# Patient Record
Sex: Female | Born: 1960 | Hispanic: Yes | Marital: Married | Smoking: Never smoker
Health system: Southern US, Community
[De-identification: ages and names within clinical notes are randomized; demographics above are authoritative.]

## PROBLEM LIST (undated history)

## (undated) DIAGNOSIS — T7840XA Allergy, unspecified, initial encounter: Secondary | ICD-10-CM

## (undated) DIAGNOSIS — E559 Vitamin D deficiency, unspecified: Secondary | ICD-10-CM

## (undated) DIAGNOSIS — K649 Unspecified hemorrhoids: Secondary | ICD-10-CM

## (undated) DIAGNOSIS — R519 Headache, unspecified: Secondary | ICD-10-CM

## (undated) DIAGNOSIS — G8929 Other chronic pain: Secondary | ICD-10-CM

## (undated) DIAGNOSIS — R51 Headache: Secondary | ICD-10-CM

## (undated) DIAGNOSIS — K602 Anal fissure, unspecified: Secondary | ICD-10-CM

## (undated) DIAGNOSIS — K589 Irritable bowel syndrome without diarrhea: Secondary | ICD-10-CM

## (undated) DIAGNOSIS — E039 Hypothyroidism, unspecified: Secondary | ICD-10-CM

## (undated) DIAGNOSIS — U071 COVID-19: Secondary | ICD-10-CM

## (undated) DIAGNOSIS — F419 Anxiety disorder, unspecified: Secondary | ICD-10-CM

## (undated) DIAGNOSIS — K5790 Diverticulosis of intestine, part unspecified, without perforation or abscess without bleeding: Secondary | ICD-10-CM

## (undated) HISTORY — DX: Unspecified hemorrhoids: K64.9

## (undated) HISTORY — DX: Allergy, unspecified, initial encounter: T78.40XA

## (undated) HISTORY — DX: Irritable bowel syndrome, unspecified: K58.9

## (undated) HISTORY — PX: OVARY BIOPSY: SHX2143

## (undated) HISTORY — DX: Diverticulosis of intestine, part unspecified, without perforation or abscess without bleeding: K57.90

## (undated) HISTORY — DX: Headache, unspecified: R51.9

## (undated) HISTORY — DX: Vitamin D deficiency, unspecified: E55.9

## (undated) HISTORY — DX: Hypothyroidism, unspecified: E03.9

## (undated) HISTORY — DX: Other chronic pain: G89.29

## (undated) HISTORY — DX: Anxiety disorder, unspecified: F41.9

## (undated) HISTORY — DX: COVID-19: U07.1

## (undated) HISTORY — DX: Headache: R51

## (undated) HISTORY — DX: Anal fissure, unspecified: K60.2

---

## 2000-03-17 ENCOUNTER — Encounter: Payer: Self-pay | Admitting: Emergency Medicine

## 2000-03-17 ENCOUNTER — Emergency Department (HOSPITAL_COMMUNITY): Admission: EM | Admit: 2000-03-17 | Discharge: 2000-03-17 | Payer: Self-pay | Admitting: Emergency Medicine

## 2000-04-09 ENCOUNTER — Other Ambulatory Visit: Admission: RE | Admit: 2000-04-09 | Discharge: 2000-04-09 | Payer: Self-pay | Admitting: Gynecology

## 2006-09-24 ENCOUNTER — Other Ambulatory Visit: Admission: RE | Admit: 2006-09-24 | Discharge: 2006-09-24 | Payer: Self-pay | Admitting: Gynecology

## 2007-08-26 ENCOUNTER — Ambulatory Visit: Payer: Self-pay | Admitting: Internal Medicine

## 2007-08-26 DIAGNOSIS — F411 Generalized anxiety disorder: Secondary | ICD-10-CM | POA: Insufficient documentation

## 2007-08-26 DIAGNOSIS — E039 Hypothyroidism, unspecified: Secondary | ICD-10-CM | POA: Insufficient documentation

## 2007-08-26 DIAGNOSIS — M791 Myalgia, unspecified site: Secondary | ICD-10-CM | POA: Insufficient documentation

## 2007-08-26 DIAGNOSIS — IMO0001 Reserved for inherently not codable concepts without codable children: Secondary | ICD-10-CM | POA: Insufficient documentation

## 2007-08-29 LAB — CONVERTED CEMR LAB
ALT: 17 units/L (ref 0–35)
AST: 20 units/L (ref 0–37)
Albumin: 3.9 g/dL (ref 3.5–5.2)
Alkaline Phosphatase: 68 units/L (ref 39–117)
BUN: 8 mg/dL (ref 6–23)
Basophils Absolute: 0 10*3/uL (ref 0.0–0.1)
Basophils Relative: 0.3 % (ref 0.0–1.0)
Bilirubin, Direct: 0.1 mg/dL (ref 0.0–0.3)
CO2: 28 meq/L (ref 19–32)
Calcium: 9 mg/dL (ref 8.4–10.5)
Chloride: 100 meq/L (ref 96–112)
Creatinine, Ser: 0.7 mg/dL (ref 0.4–1.2)
Eosinophils Absolute: 0.2 10*3/uL (ref 0.0–0.7)
Eosinophils Relative: 2.5 % (ref 0.0–5.0)
Folate: 8.4 ng/mL
GFR calc Af Amer: 117 mL/min
GFR calc non Af Amer: 97 mL/min
Glucose, Bld: 94 mg/dL (ref 70–99)
HCT: 42 % (ref 36.0–46.0)
Hemoglobin: 14.6 g/dL (ref 12.0–15.0)
Iron: 99 ug/dL (ref 42–145)
Lymphocytes Relative: 21.4 % (ref 12.0–46.0)
MCHC: 34.8 g/dL (ref 30.0–36.0)
MCV: 96.7 fL (ref 78.0–100.0)
Monocytes Absolute: 0.5 10*3/uL (ref 0.1–1.0)
Monocytes Relative: 8.8 % (ref 3.0–12.0)
Neutro Abs: 4 10*3/uL (ref 1.4–7.7)
Neutrophils Relative %: 67 % (ref 43.0–77.0)
Platelets: 240 10*3/uL (ref 150–400)
Potassium: 3.2 meq/L — ABNORMAL LOW (ref 3.5–5.1)
RBC: 4.35 M/uL (ref 3.87–5.11)
RDW: 12.5 % (ref 11.5–14.6)
Saturation Ratios: 27.5 % (ref 20.0–50.0)
Sodium: 139 meq/L (ref 135–145)
TSH: 5.16 microintl units/mL (ref 0.35–5.50)
Total Bilirubin: 1.3 mg/dL — ABNORMAL HIGH (ref 0.3–1.2)
Total Protein: 7 g/dL (ref 6.0–8.3)
Transferrin: 257.6 mg/dL (ref >=212.0)
Vitamin B-12: 197 pg/mL — ABNORMAL LOW (ref 211–911)
WBC: 6 10*3/uL (ref 4.5–10.5)

## 2007-11-11 ENCOUNTER — Encounter (INDEPENDENT_AMBULATORY_CARE_PROVIDER_SITE_OTHER): Payer: Self-pay | Admitting: *Deleted

## 2007-11-24 ENCOUNTER — Ambulatory Visit: Payer: Self-pay | Admitting: Internal Medicine

## 2007-11-24 DIAGNOSIS — E538 Deficiency of other specified B group vitamins: Secondary | ICD-10-CM

## 2007-11-30 LAB — CONVERTED CEMR LAB
BUN: 9 mg/dL (ref 6–23)
CO2: 27 meq/L (ref 19–32)
Calcium: 9 mg/dL (ref 8.4–10.5)
Chloride: 112 meq/L (ref 96–112)
Creatinine, Ser: 0.7 mg/dL (ref 0.4–1.2)
Folate: 10.1 ng/mL
GFR calc Af Amer: 117 mL/min
GFR calc non Af Amer: 97 mL/min
Glucose, Bld: 86 mg/dL (ref 70–99)
Potassium: 3.8 meq/L (ref 3.5–5.1)
Sodium: 141 meq/L (ref 135–145)
TSH: 3.2 microintl units/mL (ref 0.35–5.50)
Vitamin B-12: 271 pg/mL (ref 211–911)

## 2007-12-01 ENCOUNTER — Encounter (INDEPENDENT_AMBULATORY_CARE_PROVIDER_SITE_OTHER): Payer: Self-pay | Admitting: *Deleted

## 2007-12-24 ENCOUNTER — Encounter: Payer: Self-pay | Admitting: Gynecology

## 2007-12-24 ENCOUNTER — Other Ambulatory Visit: Admission: RE | Admit: 2007-12-24 | Discharge: 2007-12-24 | Payer: Self-pay | Admitting: Gynecology

## 2007-12-24 ENCOUNTER — Ambulatory Visit: Payer: Self-pay | Admitting: Gynecology

## 2008-01-02 ENCOUNTER — Ambulatory Visit: Payer: Self-pay | Admitting: Gynecology

## 2008-07-06 ENCOUNTER — Ambulatory Visit: Payer: Self-pay | Admitting: Internal Medicine

## 2008-07-06 DIAGNOSIS — R5381 Other malaise: Secondary | ICD-10-CM | POA: Insufficient documentation

## 2008-07-06 DIAGNOSIS — R1013 Epigastric pain: Secondary | ICD-10-CM

## 2008-07-06 DIAGNOSIS — R5383 Other fatigue: Secondary | ICD-10-CM

## 2008-07-06 DIAGNOSIS — K3189 Other diseases of stomach and duodenum: Secondary | ICD-10-CM

## 2008-07-06 DIAGNOSIS — R635 Abnormal weight gain: Secondary | ICD-10-CM

## 2008-07-06 LAB — CONVERTED CEMR LAB
Bilirubin Urine: NEGATIVE
Glucose, Urine, Semiquant: NEGATIVE
Ketones, urine, test strip: NEGATIVE
Nitrite: NEGATIVE
Protein, U semiquant: NEGATIVE
Specific Gravity, Urine: <1.005
Urobilinogen, UA: 0.2
WBC Urine, dipstick: NEGATIVE
pH: 7.5

## 2008-07-07 ENCOUNTER — Encounter: Payer: Self-pay | Admitting: Internal Medicine

## 2008-07-07 LAB — CONVERTED CEMR LAB
RBC / HPF: NONE SEEN (ref <3)
WBC, UA: NONE SEEN cells/hpf (ref <3)

## 2008-07-09 LAB — CONVERTED CEMR LAB
ALT: 22 units/L (ref 0–35)
Albumin: 4.1 g/dL (ref 3.5–5.2)
BUN: 13 mg/dL (ref 6–23)
Basophils Absolute: 0 10*3/uL (ref 0.0–0.1)
Bilirubin, Direct: 0.1 mg/dL (ref 0.0–0.3)
CO2: 29 meq/L (ref 19–32)
Eosinophils Absolute: 0.1 10*3/uL (ref 0.0–0.7)
Glucose, Bld: 87 mg/dL (ref 70–99)
HCT: 38.4 % (ref 36.0–46.0)
Hemoglobin: 13.5 g/dL (ref 12.0–15.0)
Lipase: 18 units/L (ref 11.0–59.0)
Lymphs Abs: 1.1 10*3/uL (ref 0.7–4.0)
MCHC: 35.1 g/dL (ref 30.0–36.0)
MCV: 95.8 fL (ref 78.0–100.0)
Monocytes Absolute: 0.2 10*3/uL (ref 0.1–1.0)
Neutro Abs: 6.2 10*3/uL (ref 1.4–7.7)
Platelets: 237 10*3/uL (ref 150.0–400.0)
Potassium: 3.5 meq/L (ref 3.5–5.1)
RDW: 12.2 % (ref 11.5–14.6)
Sodium: 139 meq/L (ref 135–145)
TSH: 3.85 microintl units/mL (ref 0.35–5.50)
Total Protein: 7.4 g/dL (ref 6.0–8.3)
Vitamin B-12: 277 pg/mL (ref 211–911)

## 2008-07-23 ENCOUNTER — Ambulatory Visit: Payer: Self-pay | Admitting: Gynecology

## 2008-07-23 ENCOUNTER — Ambulatory Visit (HOSPITAL_COMMUNITY): Admission: RE | Admit: 2008-07-23 | Discharge: 2008-07-23 | Payer: Self-pay | Admitting: Gynecology

## 2008-09-10 ENCOUNTER — Ambulatory Visit: Payer: Self-pay | Admitting: Internal Medicine

## 2008-09-10 DIAGNOSIS — J019 Acute sinusitis, unspecified: Secondary | ICD-10-CM | POA: Insufficient documentation

## 2009-01-31 ENCOUNTER — Ambulatory Visit: Payer: Self-pay | Admitting: Internal Medicine

## 2009-01-31 ENCOUNTER — Encounter (INDEPENDENT_AMBULATORY_CARE_PROVIDER_SITE_OTHER): Payer: Self-pay | Admitting: *Deleted

## 2009-01-31 DIAGNOSIS — J029 Acute pharyngitis, unspecified: Secondary | ICD-10-CM | POA: Insufficient documentation

## 2009-03-31 ENCOUNTER — Other Ambulatory Visit: Admission: RE | Admit: 2009-03-31 | Discharge: 2009-03-31 | Payer: Self-pay | Admitting: Gynecology

## 2009-03-31 ENCOUNTER — Ambulatory Visit: Payer: Self-pay | Admitting: Gynecology

## 2009-12-23 ENCOUNTER — Encounter: Admission: RE | Admit: 2009-12-23 | Discharge: 2009-12-23 | Payer: Self-pay | Admitting: Internal Medicine

## 2010-03-08 ENCOUNTER — Ambulatory Visit: Payer: Self-pay | Admitting: Gynecology

## 2010-03-17 ENCOUNTER — Ambulatory Visit: Payer: Self-pay | Admitting: Gynecology

## 2010-04-07 ENCOUNTER — Other Ambulatory Visit
Admission: RE | Admit: 2010-04-07 | Discharge: 2010-04-07 | Payer: Self-pay | Source: Home / Self Care | Admitting: Gynecology

## 2010-04-07 ENCOUNTER — Ambulatory Visit
Admission: RE | Admit: 2010-04-07 | Discharge: 2010-04-07 | Payer: Self-pay | Source: Home / Self Care | Attending: Gynecology | Admitting: Gynecology

## 2010-07-03 ENCOUNTER — Ambulatory Visit (INDEPENDENT_AMBULATORY_CARE_PROVIDER_SITE_OTHER): Payer: BC Managed Care – PPO | Admitting: Gynecology

## 2010-07-03 DIAGNOSIS — Z30017 Encounter for initial prescription of implantable subdermal contraceptive: Secondary | ICD-10-CM

## 2010-07-03 HISTORY — PX: OTHER SURGICAL HISTORY: SHX169

## 2010-07-04 ENCOUNTER — Ambulatory Visit (INDEPENDENT_AMBULATORY_CARE_PROVIDER_SITE_OTHER): Payer: BC Managed Care – PPO | Admitting: Gynecology

## 2010-07-04 DIAGNOSIS — Z30017 Encounter for initial prescription of implantable subdermal contraceptive: Secondary | ICD-10-CM

## 2011-04-02 ENCOUNTER — Encounter: Payer: Self-pay | Admitting: Anesthesiology

## 2011-04-09 ENCOUNTER — Other Ambulatory Visit (HOSPITAL_COMMUNITY)
Admission: RE | Admit: 2011-04-09 | Discharge: 2011-04-09 | Disposition: A | Discharge status: Home / Self Care | Payer: BC Managed Care – PPO | Source: Ambulatory Visit | Attending: Gynecology | Admitting: Gynecology

## 2011-04-09 ENCOUNTER — Ambulatory Visit (INDEPENDENT_AMBULATORY_CARE_PROVIDER_SITE_OTHER): Payer: BC Managed Care – PPO | Admitting: Gynecology

## 2011-04-09 ENCOUNTER — Encounter: Payer: Self-pay | Admitting: Gynecology

## 2011-04-09 ENCOUNTER — Other Ambulatory Visit: Payer: Self-pay | Admitting: Gynecology

## 2011-04-09 VITALS — BP 120/72 | Ht 61.0 in | Wt 166.0 lb

## 2011-04-09 DIAGNOSIS — Z01419 Encounter for gynecological examination (general) (routine) without abnormal findings: Secondary | ICD-10-CM

## 2011-04-09 DIAGNOSIS — R635 Abnormal weight gain: Secondary | ICD-10-CM

## 2011-04-09 DIAGNOSIS — Z862 Personal history of diseases of the blood and blood-forming organs and certain disorders involving the immune mechanism: Secondary | ICD-10-CM

## 2011-04-09 DIAGNOSIS — R29898 Other symptoms and signs involving the musculoskeletal system: Secondary | ICD-10-CM

## 2011-04-09 DIAGNOSIS — M6289 Other specified disorders of muscle: Secondary | ICD-10-CM

## 2011-04-09 DIAGNOSIS — Z8639 Personal history of other endocrine, nutritional and metabolic disease: Secondary | ICD-10-CM

## 2011-04-09 LAB — CBC WITH DIFFERENTIAL/PLATELET
Basophils Relative: 1 % (ref 0–1)
Eosinophils Absolute: 0.2 10*3/uL (ref 0.0–0.7)
Eosinophils Relative: 3 % (ref 0–5)
Lymphs Abs: 1.4 10*3/uL (ref 0.7–4.0)
MCH: 32.1 pg (ref 26.0–34.0)
MCHC: 34.5 g/dL (ref 30.0–36.0)
MCV: 93.1 fL (ref 78.0–100.0)
Platelets: 253 10*3/uL (ref 150–400)
RBC: 4.05 MIL/uL (ref 3.87–5.11)

## 2011-04-09 LAB — URINALYSIS W MICROSCOPIC + REFLEX CULTURE
Bilirubin Urine: NEGATIVE
Crystals: NONE SEEN
Glucose, UA: NEGATIVE mg/dL
Specific Gravity, Urine: 1.01 (ref 1.005–1.030)

## 2011-04-09 MED ORDER — FLUCONAZOLE 100 MG PO TABS: 100.0000 mg | ORAL_TABLET | Freq: Every day | ORAL | Status: AC

## 2011-04-09 NOTE — Progress Notes (Signed)
Linda Reese [birth year] [id number]   History:    51 y.o.  for annual exam with complaint of weight gain and nonspecific joint and body aches at times. She skips periods due to the fact that she's had a nexplanon placed last year for contraception. She's had history of hypothyroidism in the past currently on no medication last year had a TSH which was normal. She in frequently does her self breast examination. Declined flu vaccine. Last mammogram normal January 2012.  Past medical history,surgical history, family history and social history were all reviewed and documented in the EPIC chart.  Gynecologic History Patient's last menstrual period was 04/07/2011. Contraception: Nexplanon Last Pap: January 2012. Results were: normal Last mammogram: 2012. Results were: normal  Obstetric History OB History    Grav Para Term Preterm Abortions TAB SAB Ect Mult Living   2 2 2       2      # Outc Date GA Lbr Len/2nd Wgt Sex Del Anes PTL Lv   1 TRM     F SVD  No Yes   2 TRM     F SVD  No Yes       ROS:  Was performed and pertinent positives and negatives are included in the history.  Exam: chaperone present  BP 120/72  Ht 5\' 1"  (1.549 m)  Wt 166 lb (75.297 kg)  BMI 31.37 kg/m2  LMP 04/07/2011  Body mass index is 31.37 kg/(m^2).  General appearance : Well developed well nourished female. No acute distress HEENT: Neck supple, trachea midline, no carotid bruits, no thyroidmegaly Lungs: Clear to auscultation, no rhonchi or wheezes, or rib retractions  Heart: Regular rate and rhythm, no murmurs or gallops Breast:Examined in sitting and supine position were symmetrical in appearance, no palpable masses or tenderness,  no skin retraction, no nipple inversion, no nipple discharge, no skin discoloration, no axillary or supraclavicular lymphadenopathy Abdomen: no palpable masses or tenderness, no rebound or guarding Extremities: no edema or skin discoloration or tenderness  Pelvic:  Bartholin,  Urethra, Skene Glands: Within normal limits             Vagina: No gross lesions or discharge  Cervix: No gross lesions or discharge  Uterus  anteverted, normal size, shape and consistency, non-tender and mobile  Adnexa  Without masses or tenderness  Anus and perineum  normal   Rectovaginal  normal sphincter tone without palpated masses or tenderness             Hemoccult not done     Assessment/Plan:  51 y.o. female for annual exam unremarkable. Due to patient's headache symptoms of muscle body aches we'll go ahead and check her vitamin D level today. Because of her past history of hypothyroidism currently on no medication and her weight gain we'll check her TSH today. We'll also do a random blood sugar alone with her CBC her urinalysis cholesterol and Pap smear. She was encouraged to do her monthly self breast examination and to schedule her mammogram. Will notify does any abnormality of any the above mentioned tests. If she continues with the symptoms she may want to consider having the nexplanon removed. She will keep an eye on it. Literature information on exercise and diet was provided in [place]. We'll see her back in one year or when necessary.  Ok Edwards MD, 5:07 PM 04/09/2011

## 2011-04-09 NOTE — Patient Instructions (Signed)
Control del colesterol  Los niveles de colesterol en el organismo estn determinados significativamente por su dieta. Los niveles de colesterol tambin se relacionan con la enfermedad cardaca. El material que sigue ayuda a explicar esta relacin y a analizar qu puede hacer para mantener su corazn sano. No todo el colesterol es malo. Las lipoprotenas de baja densidad (LDL) forman el colesterol "malo". El colesterol malo puede ocasionar depsitos de grasa que se acumulan en el interior de las arterias. Las lipoprotenas de alta densidad (HDL) es el colesterol "bueno". Ayuda a remover el colesterol LDL "malo" de la sangre. El colesterol es un factor de riesgo muy importante para la enfermedad cardaca. Otros factores de riesgo son la hipertensin arterial, el hbito de fumar, el estrs, la herencia y el peso.   El msculo cardaco obtiene el suministro de sangre a travs de las arterias coronarias. Si su colesterol LDL ("malo") est elevado y el HDL ("bueno") es bajo, tiene un factor de riesgo para que se formen depsitos de grasa en las arterias coronarias (los vasos sanguneos que suministran sangre al corazn). Esto hace que haya menos lugar para que la sangre circule. Sin la suficiente sangre y oxgeno, el msculo cardaco no puede funcionar correctamente, y usted podr sentir dolores en el pecho (angina pectoris). Cuando una arteria coronaria se cierra completamente, una parte del msculo cardaco puede morir (infarto de miocardio).  CONTROL DEL COLESTEROL Cuando el profesional que lo asiste enva la sangre al laboratorio para conocer el nivel de colesterol, puede realizarle tambin un perfil completo de los lpidos. Con esta prueba, se puede determinar la cantidad total de colesterol, as como los niveles de LDL y HDL. Los triglicridos son un tipo de grasa que circula en la sangre y que tambin puede utilizarse para determinar el riesgo de enfermedad  cardaca. En la siguiente tabla se establecen los nmeros ideales: Prueba: Colesterol total  Menos de 200 mg/dl.  Prueba: LDL "colesterol malo"  Menos de 100 mg/dl.   Menos de 70 mg/dl si tiene riesgo muy elevado de sufrir un ataque cardaco o muerte cardaca sbita.  Prueba: HDL "colesterol bueno"  Mujeres: Ms de 50 mg/dl.   Hombres: Ms de 40 mg/dl.  Prueba: Trigliceridos  Menos de 150 mg/dl.    CONTROL DEL COLESTEROL CON DIETA Aunque factores como el ejercicio y el estilo de vida son importantes, la "primera lnea de ataque" es la dieta. Esto se debe a que se sabe que ciertos alimentos hacen subir el colesterol y otros lo bajan. El objetivo debe ser equilibrar los alimentos, de modo que tengan un efecto sobre el colesterol y, an ms importante, reemplazar las grasas saturadas y trans con otros tipos de grasas, como las monoinsaturadas y las poliinsaturadas y cidos grasos omega-3 . En promedio, una persona no debe consumir ms de 15 a 17 g de grasas saturadas por da. Las grasas saturadas y trans se consideran grasas "malas", ya que elevan el colesterol LDL. Las grasas saturadas se encuentran principalmente en productos animales como carne, manteca y crema. Pero esto no significa que usted debe sacrificar todas sus comidas favoritas. Actualmente, como lo muestra el cuadro que figura al final de este documento, hay sustitutos de buen sabor, bajos en grasas y en colesterol, para la mayora de los alimentos que a usted le gusta comer. Elija aquellos alimentos alternativos que sean bajos en grasas o sin grasas. Elija cortes de carne del cuarto trasero o lomo ya que estos cortes son los que tienen menor cantidad de grasa   y colesterol. El pollo (sin piel), el pescado, la carne de ternera, y la pechuga de pavo molida son excelentes opciones. Elimine las carnes grasosas como los hotdogs o el salami. Los mariscos tienen poco o nada de grasas saturadas. Cuando consuma carne magra, carne de aves de  corral, o pescado, hgalo en porciones de 85 gramos (3 onzas). Las grasas trans tambin se llaman "aceites parcialmente hidrogenados". Son aceites manipulados cientficamente de modo que son slidos a temperatura ambiente, tienen una larga vida y mejoran el sabor y la textura de los alimentos a los que se agregan. Las grasas trans se encuentran en la margarina, masitas, crackers y alimentos horneados.  Para hornear y cocinar, el aceite es un excelente sustituto para la mantequilla. Los aceites monoinsaturados tienen un beneficio particular, ya que se cree que disminuyen el colesterol LDL (colesterol malo) y elevan el HDL. Deber evitar los aceites tropicales saturados como el de coco y el de palma.  Recuerde, adems, que puede comer sin restricciones los grupos de alimentos que son naturalmente libres de grasas saturadas y grasas trans, entre los que se incluyen el pescado, las frutas (excepto el aguacate), verduras, frijoles, cereales (cebada, arroz, cuzcuz, trigo) y las pastas (sin salsas con crema)   IDENTIFIQUE LOS ALIMENTOS QUE DISMINUYEN EL COLESTEROL  Pueden disminuir el colesterol las fibras solubles que estn en las frutas, como las manzanas, en los vegetales como el brcoli, las patatas y las zanahorias; en las legumbres como frijoles, guisantes y lentejas; y en los cereales como la cebada. Los alimentos fortificados con fitosteroles tambin pueden disminuir el colesterol. Debe consumir al menos 2 g de estos alimentos a diario para obtener el efecto de disminucin de colesterol.  En el supermercado, lea las etiquetas de los envases para identificar los alimentos bajos en grasas saturadas, libres de grasas trans y bajos en grasas, . Elija quesos que tengan solo de 2 a 3 g de grasa saturada por onza (28,35 g). Use una margarina que no dae el corazn, libre de grasas trans o aceite parcialmente hidrogenado. Al comprar alimentos horneados (galletitas dulces y galletas) evite el aceite parcialmente  hidrogenado. Los panes y bollos debern ser de granos enteros (harina de maz o de avena entera, en lugar de "harina" o "harina enriquecida"). Compre sopas en lata que no sean cremosas, con bajo contenido de sal y sin grasas adicionadas.   TCNICAS DE PREPARACIN DE LOS ALIMENTOS  Nunca fra los alimentos en aceite abundante. Si debe frer, hgalo en poco aceite y removiendo constantemente, porque as se utilizan muy pocas grasas, o utilice un spray antiadherente. Cuando le sea posible, hierva, hornee o ase las carnes y cocine los vegetales al vapor. En vez de aderezar los vegetales con mantequilla o margarina, utilice limn y hierbas, pur de manzanas y canela (para las calabazas y batatas), yogurt y salsa descremados y aderezos para ensaladas bajos en contenido graso.   BAJO EN GRASAS SATURADAS / SUSTITUTOS BAJOS EN GRASA  Carnes / Grasas saturadas (g)  Evite: Bife, corte graso (3 oz/85 g) / 11 g   Elija: Bife, corte magro (3 oz/85 g) / 4 g   Evite: Hamburguesa (3 oz/85 g) / 7 g   Elija:  [place] magra (3 oz/85 g) / 5 g   Evite: Jamn (3 oz/85 g) / 6 g   Elija:  Jamn magro (3 oz/85 g) / 2.4 g   Evite: Pollo, con piel (3 oz/85 g), Carne oscura / 4 g   Elija:  Pollo, sin piel (  3 oz/85 g), Carne oscura / 2 g   Evite: Pollo, con piel (3 oz/85 g), Carne magra / 2.5 g   Elija: Pollo, sin piel (3 oz/85 g), Carne magra / 1 g  Lcteos / Grasas saturadas (g)  Evite: Leche entera (1 taza) / 5 g   Elija: Leche con bajo contenido de grasa, 2% (1 taza) / 3 g   Elija: Leche con bajo contenido de grasa, 1% (1 taza) / 1.5 g   Elija: Leche descremada (1 taza) / 0.3 g   Evite: Queso duro (1 oz/28 g) / 6 g   Elija: Queso descremado (1 oz/28 g) / 2-3 g   Evite: Queso cottage, 4% grasa (1 taza)/ 6.5 g   Elija: Queso cottage con bajo contenido de grasa, 1% grasa (1 taza)/ 1.5 g   Evite: Helado (1 taza) / 9 g   Elija: Sorbete (1 taza) / 2.5 g   Elija: Yogurt helado sin contenido de  grasa (1 taza) / 0.3 g   Elija: Barras de fruta congeladas / vestigios   Evite: Crema batida (1 cucharada) / 3.5 g   Elija: Batidos glac sin lcteos (1 cucharada) / 1 g  Condimentos / Grasas saturadas (g)  Evite: Mayonesa (1 cucharada) / 2 g   Elija: Mayonesa con bajo contenido de grasa (1 cucharada) / 1 g   Evite: Manteca (1 cucharada) / 7 g   Elija: Margarina extra light (1 cucharada) / 1 g   Evite: Aceite de coco (1 cucharada) / 11.8 g   Elija: Aceite de oliva (1 cucharada) / 1.8 g   Elija: Aceite de maz (1 cucharada) / 1.7 g   Elija: Aceite de crtamo (1 cucharada) / 1.2 g   Elija: Aceite de girasol (1 cucharada) / 1.4 g   Elija: Aceite de soja (1 cucharada) / 2.4 g   Elija: Aceite de canola (1 cucharada) / 1 g  Document Released: 03/12/2005 Document Revised: 11/22/2010 [employer] Patient Information 2012 [employer]. Ejercicios para perder peso (Exercise to Lose Weight) La actividad fsica y una dieta saludable ayudan a perder peso. El mdico podr sugerirle ejercicios especficos. IDEAS Y CONSEJOS PARA HACER EJERCICIOS  Elija opciones econmicas que disfrute hacer , como caminar, andar en bicicleta o los vdeos para ejercitarse.   Utilice las escaleras en lugar del ascensor.   Camine durante la hora del almuerzo.   Estacione el auto lejos del lugar de trabajo o estudio.   Concurra a un gimnasio o tome clases de gimnasia.   Comience con 5  10 minutos de actividad fsica por da. Ejercite hasta 30 minutos, 4 a 6 das por semana.   Utilice zapatos que tengan un buen soporte y ropas cmodas.   Elongue antes y despus de ejercitar.   Ejercite hasta que aumente la respiracin y el corazn palpite rpido.   Beba agua extra cuando ejercite.   No haga ejercicio hasta lastimarse, sentirse mareado o que le falte mucho el aire.  La actividad fsica puede quemar alrededor de 150 caloras.  Correr 20 cuadras en 15 minutos.   Jugar vley durante 45 a 60  minutos.   Limpiar y encerar el auto durante 45 a 60 minutos.   Jugar ftbol americano de toque.   Caminar 25 cuadras en 35 minutos.   Empujar un cochecito 20 cuadras en 30 minutos.   Jugar baloncesto durante 30 minutos.   Rastrillar hojas secas durante 30 minutos.   Andar en bicicleta 80 cuadras en 30 minutos.     Caminar 30 cuadras en 30 minutos.   Bailar durante 30 minutos.   Quitar la nieve con una pala durante 15 minutos.   Nadar vigorosamente durante 20 minutos.   Subir escaleras durante 15 minutos.   Andar en bicicleta 60 cuadras durante 15 minutos.   Arreglar el jardn entre 30 y 45 minutos.   Saltar a la soga durante 15 minutos.   Limpiar vidrios o pisos durante 45 a 60 minutos.  Document Released: 06/16/2010 Document Revised: 11/22/2010 [employer] Patient Information 2012 [employer]. 

## 2011-04-11 LAB — URINE CULTURE: Colony Count: NO GROWTH

## 2011-05-18 ENCOUNTER — Encounter: Payer: Self-pay | Admitting: Gynecology

## 2012-05-09 ENCOUNTER — Ambulatory Visit (INDEPENDENT_AMBULATORY_CARE_PROVIDER_SITE_OTHER): Payer: BC Managed Care – PPO | Admitting: Gynecology

## 2012-05-09 ENCOUNTER — Encounter: Payer: Self-pay | Admitting: Gynecology

## 2012-05-09 VITALS — BP 102/62 | Ht 61.0 in | Wt 156.0 lb

## 2012-05-09 DIAGNOSIS — Z01419 Encounter for gynecological examination (general) (routine) without abnormal findings: Secondary | ICD-10-CM

## 2012-05-09 DIAGNOSIS — Z840 Family history of diseases of the skin and subcutaneous tissue: Secondary | ICD-10-CM

## 2012-05-09 DIAGNOSIS — N760 Acute vaginitis: Secondary | ICD-10-CM

## 2012-05-09 DIAGNOSIS — Z8489 Family history of other specified conditions: Secondary | ICD-10-CM

## 2012-05-09 DIAGNOSIS — L568 Other specified acute skin changes due to ultraviolet radiation: Secondary | ICD-10-CM | POA: Insufficient documentation

## 2012-05-09 DIAGNOSIS — R21 Rash and other nonspecific skin eruption: Secondary | ICD-10-CM | POA: Insufficient documentation

## 2012-05-09 DIAGNOSIS — Z23 Encounter for immunization: Secondary | ICD-10-CM

## 2012-05-09 DIAGNOSIS — A499 Bacterial infection, unspecified: Secondary | ICD-10-CM

## 2012-05-09 DIAGNOSIS — K649 Unspecified hemorrhoids: Secondary | ICD-10-CM

## 2012-05-09 DIAGNOSIS — K5900 Constipation, unspecified: Secondary | ICD-10-CM | POA: Insufficient documentation

## 2012-05-09 DIAGNOSIS — N898 Other specified noninflammatory disorders of vagina: Secondary | ICD-10-CM

## 2012-05-09 LAB — HEMOGLOBIN A1C
Hgb A1c MFr Bld: 5.3 % (ref <5.7)
Mean Plasma Glucose: 105 mg/dL (ref <117)

## 2012-05-09 LAB — URINALYSIS W MICROSCOPIC + REFLEX CULTURE
Bacteria, UA: NONE SEEN
Bilirubin Urine: NEGATIVE
Casts: NONE SEEN
Crystals: NONE SEEN
Glucose, UA: NEGATIVE mg/dL
Ketones, ur: NEGATIVE mg/dL
Nitrite: NEGATIVE
Specific Gravity, Urine: 1.029 (ref 1.005–1.030)
pH: 5.5 (ref 5.0–8.0)

## 2012-05-09 LAB — CBC WITH DIFFERENTIAL/PLATELET
Eosinophils Absolute: 0.2 10*3/uL (ref 0.0–0.7)
Hemoglobin: 14.1 g/dL (ref 12.0–15.0)
Lymphocytes Relative: 22 % (ref 12–46)
Lymphs Abs: 1.4 10*3/uL (ref 0.7–4.0)
MCH: 32.1 pg (ref 26.0–34.0)
MCV: 90.9 fL (ref 78.0–100.0)
Monocytes Relative: 7 % (ref 3–12)
Neutrophils Relative %: 67 % (ref 43–77)
RBC: 4.39 MIL/uL (ref 3.87–5.11)
WBC: 6.3 10*3/uL (ref 4.0–10.5)

## 2012-05-09 LAB — TSH: TSH: 5.155 u[IU]/mL — ABNORMAL HIGH (ref 0.350–4.500)

## 2012-05-09 LAB — WET PREP FOR TRICH, YEAST, CLUE

## 2012-05-09 MED ORDER — PRAMOXINE HCL 1 % RE FOAM: RECTAL | Status: DC | PRN

## 2012-05-09 MED ORDER — FLUCONAZOLE 100 MG PO TABS: ORAL_TABLET | ORAL | Status: DC

## 2012-05-09 MED ORDER — METRONIDAZOLE 500 MG PO TABS: 500.0000 mg | ORAL_TABLET | Freq: Two times a day (BID) | ORAL | Status: DC

## 2012-05-09 NOTE — Patient Instructions (Addendum)
Vacuna difteria/ttanos (Td) o [place] difteria, ttanos, tos convulsa (Tdap), Lo que debe saber (Tetanus, Diphtheria [Td] or Tetanus, Diphtheria, Pertussis [Tdap] Vaccine, What You Need to Know) PORQU VACUNARSE? El ttanos , la difteria y la tos ferina pueden ser enfermedades graves.  El TTANOS  (trismo) provoca la contraccin dolorosa y rigidez de los msculos, por lo general, en todo el cuerpo.   Puede causar la contraccin de los msculos de la cabeza y el cuello de modo que el enfermo no puede abrir la boca ni tragar., y en algunos casos, tampoco puede respirar.. El ttanos causa la muerte de 1 de cada 5 personas que se infectan. LA DIFTERIA produce la formacin de una membrana gruesa que cubre el fondo de la garganta.  Puede causar problemas respiratorios, parlisis, insuficiencia cardaca, e incluso la muerte. El PERTUSIS (tos [place]) causa ataques de tos intensa que pueden dificultar la respiracin, provocar vmitos e interrumpir el sueo.   Puede causar prdida de peso, incontinencia, fractura de [place], y desmayos por la intensa tos. Hasta de 2 de cada 100 adolescentes y 5 de cada 100 adultos que enferman de tos [place] deben ser hospitalizados o tienen complicaciones como la neumona y la [place]. Estas 3 enfermedades son provocadas por bacterias. La difteria y la tos Benetta Spar se [place] de persona a Social worker. El ttanos ingresa al organismo a travs de cortes, rasguos o heridas. En los [place] ocurran alrededor de 200 000 casos por ao de difteria y tos [place], antes de que existieran las [place], y tambin ocurran cientos de casos de ttanos. Desde la aparicin de las vacunas, el ttanos y la difteria han disminuido en alrededor del 99% y los casos de tos ferina disminuyeron aproximadamente el 92%.  Los nios menores de 6 aos deben recibir la vacuna DTaP para estar protegidos contra estas tres enfermedades. Pero los [employer], los adolescentes y los adultos tambin  necesitan proteccin. VACUNAS PARA ADOLESCENTES Y ADULTOS Vacunas Tdap y Td  Hay dos vacunas disponibles para proteger de estas enfermedades a nios a Glass blower/designer de los 7aos:   La vacuna Td fue utilizada durante muchos aos. [employer]ge contra el ttanos y la difteria.  La vacuna Tdap fue autorizada en 2005. Es la primera vacuna para adolescentes y adultos que protege contra la tos ferina y el ttanos y la difteria. Una dosis de refuerzo de la Td se recomienda cada 10 aos. La Tdap se aplica slo una vez.  QU VACUNA DEBO APLICARME Y CUANDO? Las edades de 7 a 18 aos  [employer] 11 y los 12 aos se recomienda una dosis de Tdap. Esta dosis puede aplicarse desde los 7 aos en los nios que no han recibido una o ms dosis de DTaP anteriormente.  Los nios y adolescentes que no recibieron todas las dosis programadas de DTaP o DTP a los 7 aos deben completar la serie usando una combinacin de Td y Tdap. Adultos de 19 aos o ms  [employer] adultos deben recibir una dosis de refuerzo de Td cada 10 aos. Los adultos de menos de 65 aos que nunca hayan recibido la Tdap deben reemplazarla por la siguiente dosis de refuerzo. Los adultos a partir de los 65 aos puedenrecibir una dosis de Tdap.  Los adultos (incluyendo las mujeres que podran quedar embarazadas y los adultos mayores de 65 aos) que tienen contacto cercano con un beb menor de 12 meses deben aplicarse una dosis de Tdap para proteger al beb de la tos [place].  Los trabajadores de  la salud que tengan contacto directo con pacientes en hospitales o clnicas deben recibir una dosis de Tdap. Proteccin despus de [place] herida  Es posible que una persona que tenga un corte o quemadura grave necesite una dosis de Td o Tdap para prevenir la infeccin por ttanos. Puede usarse la Tdap en personas que nunca recibieron una dosis. Pero debe usarse la Td, si la Tdap no se encuentra disponible, o para:  Cualquier persona que haya recibido una dosis de  Tdap.  Los nios [employer] 7 y los 9 aos que han [employer] series de DTap anteriormente.  Adultos de 65 aos o ms. Mujeres embarazadas.   Las mujeres embarazadas que nunca recibieron una dosis de Ddap deben recibirla despus de la 20a semana de gestacin y preferiblemente durante Contractor. trimestre. Si no se aplican la Tdap durante el embarazo, deben recibirla lo antes posible despus del parto. Las mujeres embarazadas que han recibido la Tdap y tienen que aplicarse la vacuna contra el ttanos o la difteria durante el [place], deben recibir la Td. Las vacunas Tdap y Td pueden ser administradas al mismo tiempo que otras vacunas. ALGUNAS PERSONAS NO DEBEN RECIBIR LA VACUNA O DEBEN [employer]  Las personas que hayan tenido una reaccin alrgica que haya puesto en peligro su vida despus de una dosis de vacuna contra el ttanos, la difteria o la tos ferina no deben recibir Td ni Tdap..  Las personas que tengan alergias graves a algn componente de una vacuna no deben recibir esa vacuna. Informe a su mdico si la persona que recibe la vacuna sufre alergias graves.  Cualquier persona que [employer] Companies en coma o que haya tenido convulsiones dentro de los 7 [address]  [address] posteriores despus de una dosis de DTP o DTaP no debe recibir la Tdap, salvo que se encuentre una causa que no fuera la vacuna. Estas personas pueden recibir Td.  Consulte a su mdico si la persona que recibe [place] de las vacunas:  Tiene epilepsia o algn otro problema del sistema nervioso.  Tuvo inflamacin o dolor intenso despus de una dosis de DTP, DTaP, DT, Td, o Tdap.  Ha tenido el sndrome de Scientific laboratory technician (GBS por sus siglas en ingls). Las personas que sufran una enfermedad moderada o grave el da en que se programa la vacuna, deben esperar a recuperarse para recibir las vacunas Tdap o Td. Por lo general, una persona con una enfermedad leve o fiebre baja puede recibir la vacuna. CULES SON LOS RIESGOS DE LAS VACUNAS TDAP Y  TD? Con Cathleen Corti, al igual que con cualquier [employer], siempre existe un pequeo riesgo de una reaccin alrgica que ponga en peligro la vida o cause otro problema grave. Todo procedimiento mdico, inclusive la vacunacin pueden causar breves episodios de lipotimia o sntomas relacionados (como movimientos espasmdicos). Para evitar los [employer] y las lesiones causadas por las cadas, permanezca sentado o recustese durante los 15 minutos posteriores a la vacunacin. Informe a su mdico si el paciente se siente dbil o mareado, tiene cambios en la visin o siente zumbidos en los odos.  Es mucho ms probable que tener ttanos, difteria, o tos ferina cause problemas ms graves que los provocados por recibir cualquiera de las vacunas Td o Tdap. A continuacin se enumeran los problemas informados despus de las vacunas Td y Tdap. Problemas Leves (perceptibles, pero que no interfirieron con las actividades): Tdap  Dolor (alrededor de 3 de cada 4 adolescentes y 2 de cada 3 adultos).  Enrojecimiento o inflamacin en  el sitio de la inyeccin (alrededor de 1 de cada 5).  Fiebre leve de al menos 100.4 F (38 C) (hasta alrededor de 1 cada 25 adolescentes y 1 de cada 100 adultos).  Dolor de cabeza (alrededor de 4 de cada 10 adolescentes y 3 de cada 10 adultos).  Cansancio (alrededor de 1 de cada 3 adolescentes y 1 de cada 4 adultos).  Nuseas, vmitos, diarrea, o dolor de estmago (hasta 1 de cada 4 adolescentes y 1 de cada 10 adultos).  Escalofros, dolores corporales, dolor articular, erupciones, o inflamacin de las glndulas (poco frecuente). Td  Dolor (hasta alrededor de 8 de cada 10).  Enrojecimiento o inflamacin de la inyeccin (alrededor de 1 de cada 3).  Fiebre leve (hasta alrededor de 1 de cada 5).  Dolor de cabeza o cansancio (poco frecuente). Problemas Moderados (interfieren con las [place], [place] no requieren atencin mdica): Tdap  Dolor en el sitio de la inyeccin  (alrededor de 1 de cada 20 adolescentes y 1 de cada 100 adultos).  Enrojecimiento o inflamacin de la inyeccin (alrededor de 1 de cada 16 adolescentes y 1 de cada 25 adultos).  Fiebre de ms de 102 F (38.9 C) (alrededor de 1 de cada 100 adolescentes y 1 de cada 250 adultos).  Dolor de cabeza (1 de cada 300).  Nuseas, vmitos, diarrea, o dolor de estmago (hasta 3 de cada 100 adolescentes y 1 de cada 100 adultos). Td  Fiebre de ms de 102 F (38.9 C) (poco comn). Tdap o Td  Inflamacin de gran extensin en el brazo en el que se aplic la vacuna (hasta 3 de cada 100). Problemas [place] (no puede realizar [employer]; requiere Psychologist, prison and probation services) Tdap o Td  Inflamacin, dolor intenso, sangrado y enrojecimiento en el brazo, en el sitio de la inyeccin (poco frecuente). Puede producirse una reaccin alrgica grave despus de cualquier vacuna. Se estima que estas reacciones ocurren en menos de una de cada un milln de dosis. QU PASA SI HAY UNA REACCIN GRAVE? Qu signos debo buscar? Cualquier estado poco habitual, como una reaccin alrgica grave o fiebre alta. Si le produce Runner, broadcasting/film/video grave, se manifestar dentro de algunos minutos a una hora despus de recibir la vacuna. Entre los signos de Automotive engineer grave se encuentran la dificultad para respirar, debilidad, ronquera o sibilancias, latidos cardacos acelerados, urticaria, mareos, palidez, o inflamacin de la garganta. Qu debo hacer?  Comunquese con su mdico o lleve inmediatamente a la persona al mdico.  Dgale a su mdico qu ocurri, la fecha y hora en que sucedi y cundo le aplicaron la vacuna.  Pida a su mdico que informe sobre la reaccin llenando un formulario del Sistema de Informacin de Reacciones Adversos a las Administrator, arts (VAERS, por sus siglas en ingls). O, puede presentar este informe a travs del sitio web de VAERS [email or web address] o puede llamar al [phone]. VAERS no brinda  asistencia mdica. PROGRAMA NACIONAL DE COMPENSACIN DE DAOS POR VACUNAS El [place] de Compensacin de Daos por Vacunas (VICP) fue creado en 1986.  Aquellas personas que consideren que han sufrido un dao como consecuencia de una vacuna y quieren saber ms acerca del programa y como presentar Roslynn Amble, [place] llamar al [phone] o visitar su sitio web en [email or web address]  CMO Roxan Diesel MS INFORMACIN?  El profesional podr darle el prospecto de la vacuna o sugerirle otras fuentes de informacin.  Comunquese con el servicio de salud de su localidad o [address].  Comunquese con los Centros para el  control y la prevencin de Child psychotherapist for Disease Control and Prevention , CDC).  Llame al [phone] (1-800-CDC-INFO).  Visite los sitios web de Energy Transfer Partners en PicCapture.uy CDC Td and Tdap Interim VIS-Spanish (04/18/10) Document Released: 06/28/2008 Document Revised: 06/04/2011 [facility] Patient Information 2013 [place], [place].  Vaginosis bacteriana (Bacterial Vaginosis) La vaginosis bacteriana es una infeccin vaginal en la que el equilibrio normal de las bacterias de la vagina se modifica. Este equilibrio normal se ve afectado por un desarrollo excesivo de ciertas bacterias. Hay diferentes tipos de bacteria que causan la vaginosis bacteriana. Es el problema vaginal ms comn en las mujeres de edad frtil. CAUSAS  La causa de este trastorno no se conoce bien. Se produce como consecuencia de un aumento o desequilibrio de las bacterias nocivas.  Algunas actividades o conductas pueden poner en peligro el equilibrio normal de las bacterias en la vagina, y Astronomer. Entre ellas:  Tener un compaero sexual o mltiples compaeros sexuales.  Las duchas vaginales  Usar un dispositivo intrauterino (DIU) como mtodo anticonceptivo.  No se conoce el papel que juega la actividad sexual en el desarrollo de [place] VB. Sin  embargo, las mujeres que nunca tuvieron relaciones sexuales raramente se infectan. El contagio no se produce en asientos de baos, camas, piscinas o por tocar objetos.  SNTOMAS  Flujo vaginal grisceo.  Olor parecido al pescado con la secrecin, en especial despus de Management consultant.  Picazn o irritacin de la vagina y la vulva.  Ardor o dolor al [employer].  Algunas mujeres no presentan ningn sntoma. DIAGNSTICO El mdico realizar un examen vaginal para diagnosticar una vaginosis bacteriana. El mdico le indicar anlisis de laboratorio y observar las muestras del lquido vaginal en el microscopio. Buscar bacterias y clulas anormales (clulas clave), pH mayor a 4.5 y [place] prueba de aminas positivo, todos ellos asociados al BV.  RIESGOS Y COMPLICACIONES  Enfermedad plvica inflamatoria (EPI).  Infecciones luego de una ciruga ginecolgica.  VIH.  Virus del Herpes TRATAMIENTO En algunos casos, la infeccin desaparece sin tratamiento. Sin embargo, todas las mujeres con sntomas de VB deben tratarse para evitar complicaciones, especialmente si se ha planificado una ciruga ginecolgica. Los compaeros varones generalmente no necesitan tratamiento. Sin embargo, puede contagiarse entre parejas femeninas, de modo que el tratamiento se realiza para Dietitian.   La VB puede tratarse con medicamentos que destruyen grmenes (antibiticos). Estos se presentan en pldoras o en cremas vaginales. Tanto mujeres embarazadas como no embarazadas pueden usar ambos, pero se indican en dosis diferentes. Estos antibiticos no daan al beb.  La VB puede recurrir [employer]. Si esto ocurre, se prescribir un segundo tratamiento con antibiticos.  El tratamiento es importante en el caso de las mujeres [place]. Si no se trata, la VB puede causar [employer], especialmente en [employer] que ha tenido un parto prematuro en el pasado. Todas las mujeres embarazadas que  tienen sntomas de VB deben ser controladas y tratadas.  En los casos de recurrencia crnica, se prescribe un tratamiento con un gel vaginal dos veces por semana INSTRUCCIONES PARA EL CUIDADO DOMICILIARIO  Tome los medicamentos que le indic el mdico.  No mantenga relaciones sexuales Librarian, academic.  Comunique a sus compaeros sexuales que sufre una infeccin vaginal. Ellos deben concurrir para un control mdico si tienen problemas como una urticaria leve o picazn.  Practique el sexo seguro. Use preservativos. Tenga un solo compaero sexual. PREVENCIN Algunos pasos bsicos de prevencin pueden ayudar a Software engineer de  desequilibrio de las bacterias vaginales y de sufrir VB.  No mantener relaciones sexuales (abstinencia)  No utilice duchas vaginales.  Utilice todos los [employer] Health han prescripto para el [place], aunque los sntomas hayan desaparecido.  Comunique a su compaero sexual que sufre una VB. De ese modo podr tratase, si es necesario, y podr Economist. SOLICITE ATENCIN MDICA SI:  Los sntomas no mejoran luego de [address]  [address] de [place].  Aumentan la secrecin, el dolor o la fiebre. ASEGRESE QUE:   Comprende estas instrucciones.  Controlar su enfermedad.  Solicitar ayuda de inmediato si no mejora o empeora. PARA MS INFORMACIN: Division de STD Prevention (DSTDP), Centers for Disease Control and Prevention (Centros para el control y la prevencin de enfermedades, CDC): SolutionApps.co.za American Social Health Association (ASHA): www.ashastd.org  Document Released: 06/19/2007 Document Revised: 06/04/2011 [facility] Patient Information 2013 [place], [place].

## 2012-05-10 NOTE — Progress Notes (Addendum)
Linda Reese [birth year] [id number]   History:    52 y.o.  for annual gyn exam with several complaints today. She stated that she has photosensitivity and has also developed rash on her face at times. She also stated that she has had joint discomfort as well. She was also complaining of hemorrhoids and has been evaluated and treated for this. She also suffers from constipation and also was having a vaginal discharge as well. Her last mammogram was normal in 2013. Patient states she does her monthly self breast examination. Patient had the nexplanon placed in 2012 in her right arm. She states she is beginning to exercise more regularly and eating healthy she was weighing 166 and is down 256 pounds. She states she is taking her calcium and vitamin D. Interestingly she states that her father had history of lupus.  Past medical history,surgical history, family history and social history were all reviewed and documented in the EPIC chart.  Gynecologic History No LMP recorded. Contraception: Nexplanon Last Pap: 2012. Results were: normal Last mammogram: 2013. Results were: normal  Obstetric History OB History   Grav Para Term Preterm Abortions TAB SAB Ect Mult Living   2 2 2       2      # Outc Date GA Lbr Len/2nd Wgt Sex Del Anes PTL Lv   1 TRM     F SVD  No Yes   2 TRM     F SVD  No Yes       ROS: A ROS was performed and pertinent positives and negatives are included in the history.  GENERAL: No fevers or chills. HEENT: No change in vision, no earache, sore throat or sinus congestion. NECK: No pain or stiffness. CARDIOVASCULAR: No chest pain or pressure. No palpitations. PULMONARY: No shortness of breath, cough or wheeze. GASTROINTESTINAL: No abdominal pain, nausea, vomiting or diarrhea, melena or bright red blood per rectum. GENITOURINARY: No urinary frequency, urgency, hesitancy or dysuria. MUSCULOSKELETAL: No joint or muscle pain, no back pain, no recent trauma. DERMATOLOGIC: No rash, no  itching, no lesions. ENDOCRINE: No polyuria, polydipsia, no heat or cold intolerance. No recent change in weight. HEMATOLOGICAL: No anemia or easy bruising or bleeding. NEUROLOGIC: No headache, seizures, numbness, tingling or weakness. PSYCHIATRIC: No depression, no loss of interest in normal activity or change in sleep pattern.     Exam: chaperone present  BP 102/62  Ht 5\' 1"  (1.549 m)  Wt 156 lb (70.761 kg)  BMI 29.49 kg/m2  Body mass index is 29.49 kg/(m^2).  General appearance : Well developed well nourished female. No acute distress HEENT: Neck supple, trachea midline, no carotid bruits, no thyroidmegaly Lungs: Clear to auscultation, no rhonchi or wheezes, or rib retractions  Heart: Regular rate and rhythm, no murmurs or gallops Breast:Examined in sitting and supine position were symmetrical in appearance, no palpable masses or tenderness,  no skin retraction, no nipple inversion, no nipple discharge, no skin discoloration, no axillary or supraclavicular lymphadenopathy Abdomen: no palpable masses or tenderness, no rebound or guarding Extremities: no edema or skin discoloration or tenderness  Pelvic:  Bartholin, Urethra, Skene Glands: Within normal limits             Vagina: No gross lesions or discharge  Cervix: No gross lesions or discharge  Uterus  anteverted, normal size, shape and consistency, non-tender and mobile  Adnexa  Without masses or tenderness  Anus and perineum  normal   Rectovaginal  normal sphincter tone without palpated masses or  tenderness             Hemoccult not indicated     Assessment/Plan:  52 y.o. female for annual exam  Review of her record indicated in 2008 she had history of hypothyroidism and had been on Synthroid 25 mcg daily and has been off of it for several years now. We will check a TSH today. Her symptoms of lower rash and photosensitivity and joint pains merits checking an ANA for possible collagen vascular disease. (Father with history of  lupus). For her hemorrhoid symptoms she will be placed on proctoform   1% to apply every 2 hours as needed. She will be instructed to take one tablespoon of MiraLax with water or juice every morning for her constipation. She'll receive a Tdap vaccine today. Additional labs: CBC, screening cholesterol, hemoglobin A1c, and urinalysis. No Pap smear done today the new guidelines discussed. Next year patient will need a screening colonoscopy. Wet prep demonstrated both yeast and clue cells. She'll be treated for a moniliasis with Diflucan 100 mg by mouth. And for her BV she will be treated with Flagyl 500 mg twice a day for 5-7 days. Patient was reminded to schedule her mammogram and to do her monthly self breast examination. Patient is aware that her nexplanon needs to be changed next year.    Ok Edwards MD, 8:34 AM 05/10/2012

## 2012-05-11 LAB — URINE CULTURE

## 2012-05-12 ENCOUNTER — Other Ambulatory Visit: Payer: Self-pay | Admitting: Gynecology

## 2012-05-12 DIAGNOSIS — R3129 Other microscopic hematuria: Secondary | ICD-10-CM

## 2012-05-12 DIAGNOSIS — R7989 Other specified abnormal findings of blood chemistry: Secondary | ICD-10-CM

## 2012-05-12 LAB — ANA: Anti Nuclear Antibody(ANA): NEGATIVE

## 2012-05-21 ENCOUNTER — Other Ambulatory Visit: Payer: BC Managed Care – PPO

## 2012-05-21 DIAGNOSIS — R7989 Other specified abnormal findings of blood chemistry: Secondary | ICD-10-CM

## 2012-05-21 LAB — THYROID PANEL WITH TSH
T3 Uptake: 27.4 % (ref 22.5–37.0)
T4, Total: 8.6 ug/dL (ref 5.0–12.5)
TSH: 3.709 u[IU]/mL (ref 0.350–4.500)

## 2012-06-24 ENCOUNTER — Ambulatory Visit (INDEPENDENT_AMBULATORY_CARE_PROVIDER_SITE_OTHER): Payer: BC Managed Care – PPO | Admitting: Physician Assistant

## 2012-06-24 VITALS — BP 114/78 | HR 72 | Temp 98.5°F | Resp 17 | Ht 61.0 in | Wt 154.0 lb

## 2012-06-24 DIAGNOSIS — Z1329 Encounter for screening for other suspected endocrine disorder: Secondary | ICD-10-CM

## 2012-06-24 DIAGNOSIS — Z23 Encounter for immunization: Secondary | ICD-10-CM

## 2012-06-24 DIAGNOSIS — Z13228 Encounter for screening for other metabolic disorders: Secondary | ICD-10-CM

## 2012-06-24 NOTE — Progress Notes (Signed)
Patient ID: Linda Reese MRN: [id number], DOB: 09/25/1960, 52 y.o. Date of Encounter: 06/24/2012, 8:40 PM  Primary Physician: No primary provider on file.  Chief Complaint: Immunization review  HPI: 52 y.o. female with history below presents for immunization review. Patient has been here on a work visa from [place], but she plans to stay her long term/permenately at this point requiring her to get immunizations. She needs both MMR's, influenza vaccine, PPD, varicella titer (as she reports a history of chicken pox in college), and RPR to complete her paperwork. She reports having an up to date tetanus vaccine. She comes here for this secondary to finances. She is otherwise doing well.    Past Medical History  Diagnosis Date  . Hypothyroidism   . Chronic headaches      Home Meds: Prior to Admission medications   Medication Sig Start Date End Date Taking? Authorizing Provider  etonogestrel (NEXPLANON) 68 MG IMPL implant Inject 1 each into the skin once.     Yes Historical Provider, MD    Allergies: No Known Allergies  History   Social History  . Marital Status: Married    Spouse Name: N/A    Number of Children: N/A  . Years of Education: N/A   Occupational History  . Not on file.   Social History Main Topics  . Smoking status: Never Smoker   . Smokeless tobacco: Never Used  . Alcohol Use: Yes     Comment: weekend wine and margarita  . Drug Use: No  . Sexually Active: Yes    Birth Control/ Protection: Inserts     Comment: nexplanon   Other Topics Concern  . Not on file   Social History Narrative  . No narrative on file     Review of Systems: Constitutional: negative for chills, fever, night sweats, weight changes, or fatigue  HEENT: negative for vision changes, hearing loss, congestion, rhinorrhea, ST, epistaxis, or sinus pressure Cardiovascular: negative for chest pain or palpitations Respiratory: negative for hemoptysis, wheezing, shortness of  breath, or cough Abdominal: negative for abdominal pain, nausea, vomiting, diarrhea, or constipation Dermatological: negative for rash Neurologic: negative for headache, dizziness, or syncope   Physical Exam: Blood pressure 114/78, pulse 72, temperature 98.5 F (36.9 C), temperature source Oral, resp. rate 17, height 5\' 1"  (1.549 m), weight 154 lb (69.854 kg), last menstrual period 06/24/2012, SpO2 100.00%., Body mass index is 29.11 kg/(m^2). General: Well developed, well nourished, in no acute distress. Head: Normocephalic, atraumatic, eyes without discharge, sclera non-icteric, nares are without discharge. Bilateral auditory canals clear, TM's are without perforation, pearly grey and translucent with reflective cone of light bilaterally. Oral cavity moist, posterior pharynx without exudate, erythema, peritonsillar abscess, or post nasal drip.  Neck: Supple. No thyromegaly. Full ROM. No lymphadenopathy. Lungs: Clear bilaterally to auscultation without wheezes, rales, or rhonchi. Breathing is unlabored. Heart: RRR with S1 S2. No murmurs, rubs, or gallops appreciated. Msk:  Strength and tone normal for age. Extremities/Skin: Warm and dry. No clubbing or cyanosis. No edema. No rashes or suspicious lesions. Neuro: Alert and oriented X 3. Moves all extremities spontaneously. Gait is normal. CNII-XII grossly in tact. Psych:  Responds to questions appropriately with a normal affect.   Tuberculosis Risk Questionnaire  1. Were you born outside the [place] in one of the following parts of the world:    [place], [place], [place], [place] or [place]?  Yes, [place]  2. Have you traveled outside the [place] and lived for  more than one month in one of the following parts of the world:  [place], [place], [place], [place] or [place]?  No  3. Do you have a compromised immune system such as from any of the following conditions:  HIV/AIDS, organ or bone marrow transplantation,  diabetes, immunosuppressive medicines (e.g. Prednisone, Remicaide), leukemia, lymphoma, cancer of the head or neck, gastrectomy or jejunal bypass, end-stage renal disease (on dialysis), or silicosis?  No    4. Have you ever done one of the following:    Used crack cocaine, injected illegal drugs, worked or resided in jail or prison, worked or resided at a homeless shelter, or worked as a Research scientist (physical sciences) in direct contact with patients?  No  5. Have you ever been exposed to anyone with infectious tuberculosis?  No   Tuberculosis Symptom Questionnaire  Do you currently have any of the following symptoms?  1. Unexplained cough lasting more than 3 weeks? No  Unexplained fever lasting more than 3 weeks. No   3. Night Sweats (sweating that leaves the bedclothes and sheets wet)   No  4. Shortness of Breath No  5. Chest Pain No  6. Unintentional weight loss  No  7. Unexplained fatigue (very tired for no reason) No   Labs:  Varicella titer and RPR pending.  ASSESSMENT AND PLAN:  52 y.o. female here for immunization review -MMR series initiated tonight, RTC in 4 weeks for 2nd MMR -Influenza vaccine given tonight -PPD placed tonight, patient to RTC 48-72 hours for reading -Await varicella titer and RPR results -Patient history indicates up to date on tetanus  -Copies given to patient    Signed, Eula Listen, PA-C 06/24/2012 8:40 PM

## 2012-06-27 ENCOUNTER — Encounter (INDEPENDENT_AMBULATORY_CARE_PROVIDER_SITE_OTHER): Payer: BC Managed Care – PPO

## 2012-06-27 DIAGNOSIS — Z111 Encounter for screening for respiratory tuberculosis: Secondary | ICD-10-CM

## 2012-06-27 LAB — TB SKIN TEST: Induration: 0 mm

## 2012-10-07 ENCOUNTER — Encounter: Payer: Self-pay | Admitting: Gynecology

## 2013-06-05 ENCOUNTER — Ambulatory Visit (INDEPENDENT_AMBULATORY_CARE_PROVIDER_SITE_OTHER): Payer: BC Managed Care – PPO | Admitting: Gynecology

## 2013-06-05 ENCOUNTER — Encounter: Payer: Self-pay | Admitting: Gynecology

## 2013-06-05 VITALS — BP 124/78 | Ht 62.0 in | Wt 167.0 lb

## 2013-06-05 DIAGNOSIS — N949 Unspecified condition associated with female genital organs and menstrual cycle: Secondary | ICD-10-CM

## 2013-06-05 DIAGNOSIS — Z01419 Encounter for gynecological examination (general) (routine) without abnormal findings: Secondary | ICD-10-CM

## 2013-06-05 DIAGNOSIS — R102 Pelvic and perineal pain: Secondary | ICD-10-CM

## 2013-06-05 DIAGNOSIS — R635 Abnormal weight gain: Secondary | ICD-10-CM

## 2013-06-05 DIAGNOSIS — N951 Menopausal and female climacteric states: Secondary | ICD-10-CM

## 2013-06-05 LAB — CBC WITH DIFFERENTIAL/PLATELET
Basophils Absolute: 0 10*3/uL (ref 0.0–0.1)
Basophils Relative: 0 % (ref 0–1)
EOS ABS: 0.2 10*3/uL (ref 0.0–0.7)
Eosinophils Relative: 3 % (ref 0–5)
HEMATOCRIT: 37.7 % (ref 36.0–46.0)
HEMOGLOBIN: 13.1 g/dL (ref 12.0–15.0)
LYMPHS ABS: 1.9 10*3/uL (ref 0.7–4.0)
Lymphocytes Relative: 27 % (ref 12–46)
MCH: 31.5 pg (ref 26.0–34.0)
MCHC: 34.7 g/dL (ref 30.0–36.0)
MCV: 90.6 fL (ref 78.0–100.0)
MONOS PCT: 8 % (ref 3–12)
Monocytes Absolute: 0.6 10*3/uL (ref 0.1–1.0)
NEUTROS PCT: 62 % (ref 43–77)
Neutro Abs: 4.3 10*3/uL (ref 1.7–7.7)
Platelets: 253 10*3/uL (ref 150–400)
RBC: 4.16 MIL/uL (ref 3.87–5.11)
RDW: 13.1 % (ref 11.5–15.5)
WBC: 6.9 10*3/uL (ref 4.0–10.5)

## 2013-06-05 NOTE — Progress Notes (Signed)
Linda Reese 12-24-1960 [id number]   History:    53 y.o.  for annual gyn exam who had a Nexplanon put on her left arm (correction from previous dictation in which they stated that it was the right) who states now that her cycles are low bit heavier and she feels right lower quadrant discomfort before her menses. Patient has not had a colonoscopy yet. Patient with no prior history of abnormal Pap smears.  Past medical history,surgical history, family history and social history were all reviewed and documented in the EPIC chart.  Gynecologic History No LMP recorded. Patient has had an implant. Contraception: Nexplanon Last Pap: 2013. Results were: normal Last mammogram: 2014. Results were: normal  Obstetric History OB History  Gravida Para Term Preterm AB SAB TAB Ectopic Multiple Living  2 2 2       2     # Outcome Date GA Lbr Len/2nd Weight Sex Delivery Anes PTL Lv  2 TRM     F SVD  N Y  1 TRM     F SVD  N Y       ROS: A ROS was performed and pertinent positives and negatives are included in the history.  GENERAL: No fevers or chills. HEENT: No change in vision, no earache, sore throat or sinus congestion. NECK: No pain or stiffness. CARDIOVASCULAR: No chest pain or pressure. No palpitations. PULMONARY: No shortness of breath, cough or wheeze. GASTROINTESTINAL: No abdominal pain, nausea, vomiting or diarrhea, melena or bright red blood per rectum. GENITOURINARY: No urinary frequency, urgency, hesitancy or dysuria. MUSCULOSKELETAL: No joint or muscle pain, no back pain, no recent trauma. DERMATOLOGIC: No rash, no itching, no lesions. ENDOCRINE: No polyuria, polydipsia, no heat or cold intolerance. No recent change in weight. HEMATOLOGICAL: No anemia or easy bruising or bleeding. NEUROLOGIC: No headache, seizures, numbness, tingling or weakness. PSYCHIATRIC: No depression, no loss of interest in normal activity or change in sleep pattern.     Exam: chaperone present  BP  124/78  Ht 5\' 2"  (1.575 m)  Wt 167 lb (75.751 kg)  BMI 30.54 kg/m2  Body mass index is 30.54 kg/(m^2).  General appearance : Well developed well nourished female. No acute distress HEENT: Neck supple, trachea midline, no carotid bruits, no thyroidmegaly Lungs: Clear to auscultation, no rhonchi or wheezes, or rib retractions  Heart: Regular rate and rhythm, no murmurs or gallops Breast:Examined in sitting and supine position were symmetrical in appearance, no palpable masses or tenderness,  no skin retraction, no nipple inversion, no nipple discharge, no skin discoloration, no axillary or supraclavicular lymphadenopathy Abdomen: no palpable masses or tenderness, no rebound or guarding Extremities: no edema or skin discoloration or tenderness  Pelvic:  B[place]olin, Urethra, Skene Glands: Within normal limits             Vagina: No gross lesions or discharge  Cervix: No gross lesions or discharge  Uterus  anteverted, normal size, shape and consistency, non-tender and mobile  Adnexa  Without masses or tenderness  Anus and perineum  normal   Rectovaginal  normal sphincter tone without palpated masses or tenderness             Hemoccult colonoscopy scheduled for this year     Assessment/Plan:  53 y.o. female for annual exam who needs to have her Nexplanon removed in April. She will return at the end of March to have it removed and will also do an ultrasound to look at her right adnexa. Also we  will check an [place] one month after removing the subdermal implant. The following labs were ordered today: CBC, screening cholesterol, TSH, comprehensive metabolic panel, and urinalysis. Pap smear was not done today. She was given the number of my GI colleagues for her to schedule colonoscopy. We discussed importance of calcium and vitamin D for osteoporosis prevention along with regular exercise. She was reminded also on the importance of monthly breast exam.  Note: This dictation was prepared with   Dragon/digital dictation along withSmart phrase technology. Any transcriptional errors that result from this process are unintentional.   Terrance Mass MD, 4:32 PM 06/05/2013

## 2013-06-05 NOTE — Patient Instructions (Signed)
Colonoscopa (Colonoscopy) Ardelia Mems colonoscopa es un examen que se realiza para examinar todo el intestino grueso (colon). Este examen puede ayudar a Hydrographic surveyor problemas, como tumores, plipos, inflamacin y reas de Social research officer, government. El examen dura aproximadamente 1hora.  INFORME A SU MDICO:   Cualquier alergia que tenga.  Todos los [employer], incluidos vitaminas, hierbas, gotas oftlmicas, cremas y medicamentos de venta libre.  Problemas previos que usted o los [employer] de su familia hayan tenido con el uso de anestsicos.  Enfermedades de [employer].  Cirugas previas.  Enfermedades patolgicas. RIESGOS Y COMPLICACIONES  En general, se trata de un procedimiento seguro. Sin embargo, Games developer procedimiento, pueden surgir complicaciones. Las complicaciones posibles son:  Hemorragias.  Desgarro o ruptura de la pared del colon.  Reaccin a los Stage manager.  Infeccin (raro). ANTES DEL PROCEDIMIENTO   Consulte a su mdico si debe cambiar o suspender los medicamentos que toma habitualmente.  Posiblemente se le recete una preparacin del colon por va oral. Esto incluye beber una gran cantidad de lquido medicinal desde el da anterior a su procedimiento. El lquido har que elimine muchas heces blandas hasta que sean casi claras o de color verdoso claro. De esta manera limpiar el colon para prepararlo para el procedimiento.  No coma ni beba nada ms una vez que haya comenzado con la preparacin del colon, a menos que el mdico le indique que es seguro [place].  [place] a alguna persona que la lleve a su casa luego del procedimiento. PROCEDIMIENTO   Le administrarn un medicamento para que pueda relajarse (sedante).  Se recostar de costado con las rodillas flexionadas.  Se insertar un tubo largo y flexible con Ardelia Mems luz y Ardelia Mems cmara en el extremo (colonoscopio) a travs del recto y dentro del colon. La cmara enviar el video hacia  una pantalla de computadora a medida que se vaya moviendo por el colon. El colonoscopio tambin libera dixido de carbono para inflar el colon. Esto ayuda a que el mdico pueda ver mejor el rea.  Durante el examen, es posible que su mdico tome una pequea muestra de tejido (biopsia) para examinarla bajo el microscopio, si se encuentran anormalidades.  El examen finaliza cuando se ha examinado todo el colon. DESPUS DEL PROCEDIMIENTO   No conduzca vehculos durante las 24horas posteriores al examen.  Es posible que encuentre una pequea cantidad de sangre en la materia fecal.  Ileene Hutchinson tenga cantidades moderadas de gases y calambres o hinchazn abdominales leves. Esto se produce a causa del gas utilizado para inflar el colon durante el examen.  Pregunte cundo estarn [employer] del examen y cmo los obtendr. Asegrese de [place]. Document Released: 12/20/2004 Document Revised: 12/31/2012 [facility] Patient Information 2014 [place], [place].

## 2013-06-06 LAB — URINALYSIS W MICROSCOPIC + REFLEX CULTURE
BACTERIA UA: NONE SEEN
Bilirubin Urine: NEGATIVE
Casts: NONE SEEN
Crystals: NONE SEEN
Glucose, UA: NEGATIVE mg/dL
HGB URINE DIPSTICK: NEGATIVE
KETONES UR: NEGATIVE mg/dL
Leukocytes, UA: NEGATIVE
NITRITE: NEGATIVE
PROTEIN: NEGATIVE mg/dL
SPECIFIC GRAVITY, URINE: 1.029 (ref 1.005–1.030)
Squamous Epithelial / LPF: NONE SEEN
UROBILINOGEN UA: 1 mg/dL (ref 0.0–1.0)
pH: 6 (ref 5.0–8.0)

## 2013-06-06 LAB — COMPREHENSIVE METABOLIC PANEL
ALBUMIN: 4.2 g/dL (ref 3.5–5.2)
ALT: 22 U/L (ref 0–35)
AST: 23 U/L (ref 0–37)
Alkaline Phosphatase: 82 U/L (ref 39–117)
BILIRUBIN TOTAL: 0.6 mg/dL (ref 0.2–1.2)
BUN: 11 mg/dL (ref 6–23)
CO2: 25 meq/L (ref 19–32)
Calcium: 8.6 mg/dL (ref 8.4–10.5)
Chloride: 104 mEq/L (ref 96–112)
Creat: 0.65 mg/dL (ref 0.50–1.10)
GLUCOSE: 80 mg/dL (ref 70–99)
Potassium: 3.5 mEq/L (ref 3.5–5.3)
SODIUM: 139 meq/L (ref 135–145)
TOTAL PROTEIN: 6.8 g/dL (ref 6.0–8.3)

## 2013-06-06 LAB — CHOLESTEROL, TOTAL: CHOLESTEROL: 143 mg/dL (ref 0–200)

## 2013-06-06 LAB — TSH: TSH: 4.327 u[IU]/mL (ref 0.350–4.500)

## 2013-06-12 ENCOUNTER — Encounter: Payer: Self-pay | Admitting: Internal Medicine

## 2013-06-18 ENCOUNTER — Telehealth: Payer: Self-pay | Admitting: *Deleted

## 2013-06-18 NOTE — Telephone Encounter (Signed)
Pt scheduled for ultrasound on 06/22/13 to look at her right adnexa. Pt cycle has started okay to keep appointment?

## 2013-06-18 NOTE — Telephone Encounter (Signed)
Yes

## 2013-06-18 NOTE — Telephone Encounter (Signed)
Pt informed with the below. 

## 2013-06-22 ENCOUNTER — Ambulatory Visit (INDEPENDENT_AMBULATORY_CARE_PROVIDER_SITE_OTHER): Payer: BC Managed Care – PPO | Admitting: Gynecology

## 2013-06-22 ENCOUNTER — Other Ambulatory Visit: Payer: Self-pay | Admitting: Gynecology

## 2013-06-22 ENCOUNTER — Ambulatory Visit (INDEPENDENT_AMBULATORY_CARE_PROVIDER_SITE_OTHER): Payer: BC Managed Care – PPO

## 2013-06-22 DIAGNOSIS — N920 Excessive and frequent menstruation with regular cycle: Secondary | ICD-10-CM

## 2013-06-22 DIAGNOSIS — D259 Leiomyoma of uterus, unspecified: Secondary | ICD-10-CM

## 2013-06-22 DIAGNOSIS — Z309 Encounter for contraceptive management, unspecified: Secondary | ICD-10-CM

## 2013-06-22 DIAGNOSIS — R102 Pelvic and perineal pain: Secondary | ICD-10-CM

## 2013-06-22 DIAGNOSIS — IMO0001 Reserved for inherently not codable concepts without codable children: Secondary | ICD-10-CM

## 2013-06-22 DIAGNOSIS — D251 Intramural leiomyoma of uterus: Secondary | ICD-10-CM

## 2013-06-22 DIAGNOSIS — N83209 Unspecified ovarian cyst, unspecified side: Secondary | ICD-10-CM

## 2013-06-22 DIAGNOSIS — N83201 Unspecified ovarian cyst, right side: Secondary | ICD-10-CM | POA: Insufficient documentation

## 2013-06-22 DIAGNOSIS — N8300 Follicular cyst of ovary, unspecified side: Secondary | ICD-10-CM

## 2013-06-22 DIAGNOSIS — R9389 Abnormal findings on diagnostic imaging of other specified body structures: Secondary | ICD-10-CM

## 2013-06-22 DIAGNOSIS — N949 Unspecified condition associated with female genital organs and menstrual cycle: Secondary | ICD-10-CM

## 2013-06-22 DIAGNOSIS — R1031 Right lower quadrant pain: Secondary | ICD-10-CM

## 2013-06-22 LAB — LIPID PANEL
Cholesterol: 149 mg/dL (ref 0–200)
HDL: 44 mg/dL (ref >39)
LDL CALC: 91 mg/dL (ref 0–99)
Total CHOL/HDL Ratio: 3.4 Ratio
Triglycerides: 69 mg/dL (ref <150)
VLDL: 14 mg/dL (ref 0–40)

## 2013-06-22 MED ORDER — MEDROXYPROGESTERONE ACETATE 150 MG/ML IM SUSP
150.0000 mg | Freq: Once | INTRAMUSCULAR | Status: AC
  Administered 2013-06-22: 150 mg via INTRAMUSCULAR

## 2013-06-22 NOTE — Progress Notes (Signed)
   Patient presented to the office today for ultrasound due to the fact that when she was seen in the office on March 13 for annual exam she was complaining heavy cycle along with right lower quadrant pain. Patient had a Nexplanon approximately 3 years ago. It is due to have it removed next month. Patient also was not fasting when she was here before so she is having her fasting lipid profile drawn today. Her recent CBC, comprehensive metabolic panel, TSH, and urinalysis was normal.  Ultrasound today: Uterus measured 8.6 x 5.7 x 4.4 cm with endometrial stripe of 9.3 mm. Last menstrual period started 5 days ago. 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 Left ovary was normal no fluid in the cul-de-sac  Assessment/plan: #1 right simple-appearing ovarian cyst. The findings discussed with the patient. The limitations of a CA 125 were discussed and she would like to have a drawn. #2 fasting lipid profile will be obtained today since she was not fasting when she was here for her annual exam recently. #3 patient will return back next month to have her Nexplanon removed. #4 patient will receive Depo-Provera 150 mg IM in an effort to suppress the cyst and she will have a followup ultrasound in 3 months.  Literature information on all the above was provided to the patient in [place].

## 2013-06-22 NOTE — Addendum Note (Signed)
Addended by: Nelva Nay on: 06/22/2013 11:10 AM   Modules accepted: Orders

## 2013-06-22 NOTE — Patient Instructions (Signed)
Marcador tumoral CA-125 (CA-125 Tumor Marker) El CA-125 es un marcador tumoral que sirve para Aeronautical engineer evolucin del cncer de ovario o de endometrio. PREPARACIN PARA EL ESTUDIO No se requiere Scientist, research (life sciences). HALLAZGOS NORMALES Adultos: 0a35unidades/ml (0a74miliunidades/l) Los rangos para los resultados normales pueden variar entre diferentes laboratorios y hospitales. Consulte siempre con su mdico despus de Psychologist, counselling estudio para Armed forces logistics/support/administrative officer significado de los [place] y si los valores se consideran "dentro de los lmites normales". SIGNIFICADO DEL ESTUDIO  El mdico leer los resultados y Electrical engineer con usted sobre la importancia y el significado de los [place], as como las opciones de tratamiento y la necesidad de Optometrist pruebas adicionales, si fuera necesario. OBTENCIN DE LOS RESULTADOS DE LAS PRUEBAS Es su responsabilidad retirar el resultado del [place]. Consulte en el laboratorio cundo y cmo podr [employer]. Document Released: 12/31/2012 [facility] Patient Information 2014 [place], [place]. Quiste ovrico (Ovarian Cyst) Un quiste ovrico es una bolsa llena de lquido que se forma en el ovario. Los ovarios son los rganos pequeos que producen vulos en las mujeres. Se pueden formar varios tipos de [employer]. Benito Mccreedy no son cancerosos. Muchos de ellos no causan problemas y con frecuencia desaparecen solos. Algunos pueden provocar sntomas y requerir Clinical research associate. Los tipos ms comunes de quistes ovricos son los siguientes:  Quistes funcionales: estos quistes pueden aparecer todos los meses durante el ciclo menstrual. Esto es normal. Estos quistes suelen desaparecer con el prximo ciclo menstrual si la mujer no queda embarazada. En general, los quistes funcionales no tienen sntomas.  Endometriomas: estos quistes se forman a partir del tejido que recubre el tero. Tambin se denominan "quistes de chocolate" porque se llenan de sangre que  se vuelve marrn. Este tipo de quiste puede Engineer, production en la zona inferior del abdomen durante la relacin sexual y con el perodo menstrual.  Cistoadenomas: este tipo se desarrolla a partir de las clulas que se [place] en el exterior del ovario. Estos quistes pueden ser muy grandes y causar dolor en la zona inferior del abdomen y durante la relacin sexual. Cindra Presume tipo de quiste puede girar sobre s mismo, cortar el suministro de Biochemist, clinical y causar un dolor intenso. Tambin se puede romper con facilidad y Stage manager.  Quistes dermoides: este tipo de quiste a veces se encuentra en ambos ovarios. Estos quistes pueden [employer] tipos de tejidos del organismo, como piel, dientes, pelo o Database administrator. Generalmente no tienen sntomas, a menos que sean muy grandes.  Quistes tecalutenicos: aparecen cuando se produce demasiada cantidad de cierta hormona (gonadotropina corinica humana) que estimula en exceso al ovario para que produzca vulos. Esto es ms frecuente despus de procedimientos que ayudan a la concepcin de un beb (fertilizacin in vitro). CAUSAS   Los medicamentos para la fertilidad pueden provocar una afeccin mediante la cual se forman mltiples quistes de gran tamao en los ovarios. Esta se denomina sndrome de hiperestimulacin ovrica.  El sndrome del ovario poliqustico es una afeccin que puede causar desequilibrios hormonales, los cuales pueden dar como resultado quistes ovricos no funcionales. SIGNOS Y SNTOMAS  Muchos quistes ovricos no causan sntomas. Si se presentan sntomas, stos pueden ser:  Dolor o molestias en la pelvis.  Dolor en la parte baja del abdomen.  [place].  Aumento del permetro abdominal (hinchazn).  Perodos menstruales anormales.  Aumento del [employer] perodos [place].  Cese de los perodos menstruales sin estar embarazada. DIAGNSTICO  Estos quistes se descubren comnmente durante  un examen  de rutina o una exploracin ginecolgica anual. Es posible que se ordenen otros estudios para obtener ms informacin sobre el [place]. Estos estudios pueden ser:  Engineer, materials.  Radiografas de la pelvis.  Tomografa computada.  Resonancia magntica.  Anlisis de [place]. TRATAMIENTO  Muchos de los quistes ovricos desaparecen por s solos, sin tratamiento. Es probable que el mdico quiera controlar el quiste regularmente durante 2 o 80meses para ver si se produce algn cambio. En el caso de las mujeres en la menopausia, es particularmente importante controlar de cerca al quiste ya que el ndice de cncer de ovario en las mujeres menopusicas es ms alto. Cuando se requiere Clinical research associate, este puede incluir cualquiera de los siguientes:  Un procedimiento para drenar el quiste (aspiracin). Esto se puede realizar [name] uso de [place] grande y [place]. Tambin se puede hacer a travs de un procedimiento laparoscpico, En este procedimiento, se inserta un tubo delgado que emite luz y que tiene una pequea cmara en un extremo (laparoscopio) a travs de una pequea incisin.  Ciruga para extirpar el quiste completo. Esto se puede realizar mediante una ciruga laparoscpica o Ardelia Mems ciruga abierta, la cual implica realizar una incisin ms grande en la parte inferior del abdomen.  Tratamiento hormonal o pldoras anticonceptivas. Estos mtodos a veces se usan para ayudar a Writer. [place] solo medicamentos de venta libre o recetados, segn las indicaciones del mdico.  Consulting civil engineer a las consultas de control con su mdico segn las indicaciones.  Hgase exmenes plvicos regulares y pruebas de [place]. SOLICITE ATENCIN MDICA SI:   Los perodos se atrasan, son irregulares, dolorosos o cesan.  El dolor plvico o abdominal no desaparece.  El abdomen se agranda o se hincha.  Siente presin en la vejiga o no puede vaciarla  completamente.  Siente dolor durante las [employer].  Tiene una sensacin de hinchazn, presin o Manufacturing systems engineer.  Pierde peso sin razn aparente.  Siente un Pharmacist, hospital.  Est estreida.  Pierde el apetito.  Le aparece acn.  Nota un aumento del vello corporal y facial.  Elenore Rota de peso sin hacer modificaciones en su actividad fsica y en su dieta habitual.  Sospecha que est embarazada. SOLICITE ATENCIN MDICA DE INMEDIATO SI:   Siente cada vez ms dolor abdominal.  Tiene malestar estomacal (nuseas) y vomita.  Tiene fiebre que se presenta de [place] repentina.  Siente dolor abdominal al defecar.  Sus perodos menstruales son ms abundantes que lo habitual. Document Released: 12/20/2004 Document Revised: 12/31/2012 [employer] Patient Information 2014 [place].

## 2013-06-23 LAB — CA 125: CA 125: 2.9 U/mL (ref 0.0–30.2)

## 2013-07-20 ENCOUNTER — Ambulatory Visit (AMBULATORY_SURGERY_CENTER): Payer: Self-pay | Admitting: *Deleted

## 2013-07-20 VITALS — Ht 62.0 in | Wt 173.8 lb

## 2013-07-20 DIAGNOSIS — Z1211 Encounter for screening for malignant neoplasm of colon: Secondary | ICD-10-CM

## 2013-07-20 MED ORDER — MOVIPREP 100 G PO SOLR: ORAL | Status: DC

## 2013-07-20 NOTE — Progress Notes (Signed)
Patient denies any allergies to eggs or soy. Patient denies any problems with anesthesia. No oxygen use at home, no diet/wt loss pills per patient. EMMI education information given to patient on colonoscopy. Patient states that she had office visit with Dr.Mann 2 years ago for constipation and hemorrhoids but did not have colonoscopy and was not satisfied with that visit.

## 2013-07-22 ENCOUNTER — Encounter: Payer: Self-pay | Admitting: Internal Medicine

## 2013-07-24 ENCOUNTER — Encounter: Payer: Self-pay | Admitting: Gynecology

## 2013-07-24 ENCOUNTER — Ambulatory Visit (INDEPENDENT_AMBULATORY_CARE_PROVIDER_SITE_OTHER): Payer: BC Managed Care – PPO | Admitting: Gynecology

## 2013-07-24 VITALS — BP 128/86

## 2013-07-24 DIAGNOSIS — N83209 Unspecified ovarian cyst, unspecified side: Secondary | ICD-10-CM

## 2013-07-24 DIAGNOSIS — Z3046 Encounter for surveillance of implantable subdermal contraceptive: Secondary | ICD-10-CM

## 2013-07-24 MED ORDER — ALPRAZOLAM 0.25 MG PO TABS: 0.2500 mg | ORAL_TABLET | Freq: Every evening | ORAL | Status: DC | PRN

## 2013-07-24 NOTE — Progress Notes (Signed)
   Patient presented to the office today to have the Nexplanon removed since it had been 3 years since it was placed. Patient also back in March had been complaining of right lower quadrant discomfort and heavy menstrual cycle. She had an ultrasound on the last office visit which demonstrated the following:  Ultrasound today:  Uterus measured 8.6 x 5.7 x 4.4 cm with endometrial stripe of 9.3 mm. Last menstrual period started 5 days ago. 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 Left ovary was normal no fluid in the cul-de-sac   Patient had a normal CA 125 as well as CBC, comprehensive metabolic panel, TSH and urinalysis and lipid profile. She received Depo-Provera 150 mg IM and was scheduled to return in 3 months for followup on the right ovarian cyst. She is complaining of some anxiety since receiving Depo-Provera injection.  Nexplanon removal was as follows:                                                             Nexplanon procedure note (removal)  The patient presented to the office today requesting for removal of her Nexplanon that was placed in the year 2012 on her left arm.   On examination the nexplanon implant was palpated and the distal end  (end  closest to the elbow) was marked. The area was sterilized with Betadine solution. 1% lidocaine was used for local anesthesia and approximately 1 cc  was injected into the site that was marked where  the incision was to be made. The local anesthetic was injected under the implant in an effort to keep it  close to the skin surface. Slight pressure pushing downward was made at the proximal end  of the implant in an effort to stabilize it. A bulge appeared indicating the distal end of the implant. Starting at the distal tip of the implant, a small longitudinal incision of 2 mm was made towards the elbow. By gently pushing the  implant toward the incision the tip became visible. Grasping the implant with a curved forcep facilitated in gently removing the implant. Full confirmation of the entire implant which is 4 cm long was inspected and was intact and was shown to the patient and discarded. After removing the implant, the incision was closed with a Steri-Strip and applying and adhesive Kerlix bandage. Patient will be instructed to remove the pressure bandage in 24 hours and the Steri-Strips in 3-5 days.  Note: This dictation was prepared with  Dragon/digital dictation along withSmart phrase technology. Any transcriptional errors that result from this process are unintentional.

## 2013-07-24 NOTE — Patient Instructions (Signed)
Alprazolam tablets Qu es este medicamento? El ALPRAZOLAM es una benzodiacepina. Se utiliza para tratar la ansiedad y los ataques de pnico. [place] medicamento puede ser utilizado para otros usos; si tiene alguna pregunta consulte con su proveedor de atencin mdica o con su farmacutico. MARCAS COMERCIALES DISPONIBLES: Xanax Qu le debo informar a mi profesional de la salud antes de tomar este medicamento? Necesita saber si usted presenta alguno de los siguientes problemas o situaciones: -problema de alcoholismo o drogadiccin -trastorno bipolar, depresin, psicosis u otros problemas de salud mental -glaucoma -enfermedad renal o heptica -enfermedad pulmonar o respiratoria -miastenia gravis -enfermedad de Parkinson -porfiria -convulsiones o antecedentes de convulsiones -ideas suicidas -una reaccin alrgica o inusual al alprazolam, a otras benzodiacepinas, alimentos, colorantes o conservantes -si est embarazada o buscando quedar embarazada -si est amamantando a un beb Cmo debo utilizar este medicamento? Tome este medicamento por va oral con un vaso de agua. Siga las instrucciones de la etiqueta del [place]. Tome sus dosis a intervalos regulares. No tome su medicamento con una frecuencia mayor a la indicada. Si ha venido tomando [employer]a de [place] regular durante algn tiempo, no deje de tomarlo repentinamente. Debe reducir gradualmente la dosis para no sufrir efectos secundarios severos. Consulte a su mdico o a su profesional de la salud por asesoramiento. Aun despus de dejar de tomarlo, los efectos del medicamento en su cuerpo pueden perdurar [employer]. Hable con su pediatra para informarse acerca del uso de este medicamento en nios. Puede requerir atencin especial. Sobredosis: Pngase en contacto inmediatamente con un centro toxicolgico o una sala de urgencia si usted cree que haya tomado demasiado medicamento. ATENCIN: [employer] es solo para usted.  No comparta este medicamento con nadie. Qu sucede si me olvido de una dosis? Si olvida una dosis, tmela lo antes posible. Si es casi la hora de la prxima dosis, tome slo esa dosis. No tome dosis adicionales o dobles. Qu puede interactuar con este medicamento? No tome esta medicina junto con ninguno de los siguientes medicamentos: -algunos medicamentos para la infeccin por VIH o SIDA -quetoconazol -itraconazol Esta medicina tambin puede interactuar con los siguientes medicamentos: -pldoras anticonceptivas -algunos antibiticos macrlidos, tales como claritromicina, eritromicina o troleandomicina -cimetidina -ciclosporina -ergotamina -jugo de toronja -suplementos dietticos o a base de hierbas, como kava kava, melatonina, dehidroepiandrosterona, DHEA, hierba de [place] o valeriana -imatinib, STI-571 -isoniazida -levodopa -medicamentos para la depresin, ansiedad o trastornos psicticos -analgsicos recetados -rifampicina, rifapentina o rifabutina -algunos medicamentos para la presin sangunea o problemas cardiacos -algunos medicamentos para las convulsiones, tales como [place], Ali Molina, [place], Museum/gallery curator o primidona Puede ser que esta lista no menciona todas las posibles interacciones. Informe a su profesional de [employer]	[place] de [employer] productos a base de hierbas, medicamentos de [place] o suplementos nutritivos que est tomando. Si usted fuma, consume bebidas alcohlicas o si utiliza drogas ilegales, indqueselo tambin a su profesional de [employer] Home	[place]. Algunas sustancias pueden interactuar con su medicamento. A qu debo estar atento al usar Coca-Cola? Visite a su mdico o a su profesional de la salud para chequear su evolucin peridicamente. Su cuerpo puede hacerse dependiente del medicamento. Por esta razn, pregunte a su mdico o a su profesional de la salud si todava necesita tomarlo. Puede experimentar somnolencia o mareos. No conduzca ni utilice  maquinaria, ni haga nada que Associate Professor en estado de alerta hasta que sepa cmo le afecta este medicamento. Para reducir el riesgo de mareos o [place], no se ponga de pie ni se siente  con rapidez, especialmente si es un paciente de [place]. El alcohol puede aumentar su somnolencia y [place]. Evite consumir bebidas alcohlicas. No se trate usted mismo si tiene tos, resfro o Set designer sin Teacher, adult education a su mdico o a su profesional de Technical sales engineer. Algunos ingredientes pueden aumentar los posibles efectos secundarios. Qu efectos secundarios puedo tener al [employer] este medicamento? Efectos secundarios que debe informar a su mdico o a Barrister's clerk de la salud tan pronto como sea posible: -Chief of Staff como erupcin cutnea, picazn o urticarias, hinchazn de la cara, labios o lengua -confusin, tendencia a olvidar -depresin -dificultad para conciliar el sueo -dificultad para hablar -sensacin de desmayos o mareos, cadas -cambios de humor, excitabilidad o comportamiento agresivo -calambres musculares -dificultad para orinar o cambios en el volumen de orina -cansancio o debilidad inusual Efectos secundarios que, por lo general, no requieren atencin mdica (debe informarlos a su mdico o a su profesional de la salud si persisten o si son molestos): -cambios en el deseo sexual o capacidad -cambios del apetito Puede ser que esta lista no menciona todos los posibles efectos secundarios. Comunquese a su mdico por asesoramiento mdico [employer]. Usted puede informar los efectos secundarios a la FDA por telfono al 1-800-FDA-1088. Dnde debo guardar mi medicina? Mantngala fuera del alcance de los nios. Este medicamento puede ser abusado. Mantenga su medicamento en un lugar seguro para protegerlo contra robos. No comparta este medicamento con nadie. Es peligroso vender o Associate Professor y est prohibido por la ley. [place] a [employer],  entre 20 y 63 grados C (17 y 70 grados F). Deseche todo el medicamento que no haya utilizado, despus de la fecha de vencimiento. ATENCIN: Este folleto es un resumen. Puede ser que no cubra toda la posible informacin. Si usted tiene preguntas acerca de esta medicina, consulte con su mdico, su farmacutico o su profesional de Technical sales engineer.  2014, Elsevier/Gold Standard. (2007-01-03 11:33:00)

## 2013-08-03 ENCOUNTER — Encounter: Payer: BC Managed Care – PPO | Admitting: Internal Medicine

## 2013-09-30 ENCOUNTER — Other Ambulatory Visit: Payer: BC Managed Care – PPO

## 2013-09-30 ENCOUNTER — Ambulatory Visit: Payer: BC Managed Care – PPO | Admitting: Gynecology

## 2013-10-30 ENCOUNTER — Ambulatory Visit (INDEPENDENT_AMBULATORY_CARE_PROVIDER_SITE_OTHER): Payer: BC Managed Care – PPO

## 2013-10-30 ENCOUNTER — Ambulatory Visit (INDEPENDENT_AMBULATORY_CARE_PROVIDER_SITE_OTHER): Payer: BC Managed Care – PPO | Admitting: Gynecology

## 2013-10-30 ENCOUNTER — Encounter: Payer: Self-pay | Admitting: Gynecology

## 2013-10-30 DIAGNOSIS — N912 Amenorrhea, unspecified: Secondary | ICD-10-CM

## 2013-10-30 DIAGNOSIS — N951 Menopausal and female climacteric states: Secondary | ICD-10-CM

## 2013-10-30 DIAGNOSIS — N83201 Unspecified ovarian cyst, right side: Secondary | ICD-10-CM

## 2013-10-30 DIAGNOSIS — N83209 Unspecified ovarian cyst, unspecified side: Secondary | ICD-10-CM

## 2013-10-30 LAB — URINALYSIS W MICROSCOPIC + REFLEX CULTURE
Bilirubin Urine: NEGATIVE
Casts: NONE SEEN
Crystals: NONE SEEN
Glucose, UA: NEGATIVE mg/dL
Nitrite: NEGATIVE
Specific Gravity, Urine: 1.025 (ref 1.005–1.030)
Urobilinogen, UA: 0.2 mg/dL (ref 0.0–1.0)
pH: 5 (ref 5.0–8.0)

## 2013-10-30 LAB — PREGNANCY, URINE: Preg Test, Ur: NEGATIVE

## 2013-10-30 LAB — FOLLICLE STIMULATING HORMONE: FSH: 32.8 m[IU]/mL

## 2013-10-30 NOTE — Addendum Note (Signed)
Addended by: Thurnell Garbe A on: 10/30/2013 02:10 PM   Modules accepted: Orders

## 2013-10-30 NOTE — Patient Instructions (Signed)
Terapia de reemplazo hormonal (Hormone Replacement Therapy) En la menopausia, su cuerpo comienza a producir menos estrgeno y Immunologist. Esto provoca que el cuerpo deje de tener perodos [place]. Esto se debe a que el estrgeno y la progesterona controlan sus perodos y su ciclo menstrual. Ardelia Mems falta de estrgeno puede causar sntomas tales como:  Social research officer, government.  Sequedad vaginal  Piel seca.  Prdida del deseo sexual.  Riesgo de prdida de hueso (osteoporosis). Cuando esto ocurre, puede elegir realizar Ardelia Mems terapia hormonal para volver a Clinical research associate estrgeno perdido [employer]. Cuando slo se introduce esta hormona, el procedimiento se conoce normalmente como TRE (terapia de reemplazo de [place]). Cuando la hormona progestina se combina con el estrgeno, el procedimiento se conoce normalmente como TH (terapia hormonal). Esto es lo que previamente se conoca como terapia de reemplazo hormonal (TRH). El profesional que le asiste le ayudar a tomar una decisin acerca de lo que resulte lo mejor para usted. La decisin de realizar una TRH cambia a menudo debido a que se Risk manager. Muchos estudios no ponen de acuerdo con respecto a los beneficios de Optometrist una terapia de reemplazo hormonal.  BENEFICIOS PROBABLES DE LA TRH QUE INCLUYEN PROTECCIN CONTRA:  Golpes de calor - Un golpe de calor es la sensacin repentina de calor sobre la cara y el cuerpo. La piel enrojece, como al sonrojarse. Estn asociados con la transpiracin y los trastornos del sueo. Las mujeres que atraviesan la menopausia pueden tener golpes de calor unas pocas veces en el mes o varios al da; esto depende de la mujer.  Osteoporosis (prdida de hueso) - El estrgeno ayuda a protegerse contra la prdida de [place]. Luego de la [place], los huesos de una mujer pierden calcio y se vuelven frgiles y Publishing rights manager. Como resultado, es ms probable que el hueso se [place]. Los que resultan afectados con  mayor frecuencia son los de la cadera, la [place] y la columna vertebral. La terapia hormonal puede ayudar a retardar la prdida de hueso luego de la menopausia. Realizar ejercicios con peso y tomar calcio con vitamina D tambin puede ayudar a prevenir la prdida de [place]. Existen medicamentos que puede prescribir el profesional que la asiste para ayudar a prevenir la osteoporosis.  Sequedad vaginal - La prdida de estrgeno produce cambios en la vagina. El recubrimiento de la misma puede volverse fino y Education officer, museum. Estos cambios pueden causar dolor y [place]. La sequedad tambin puede producir una infeccin. Puede ocasionarle ardor y [place].  Las infecciones en las vas urinarias son ms comunes luego de la menopausia debido a la falta de Oceanographer.  Otros beneficios posibles del estrgeno incluyen un cambio positivo en el humor y en la memoria de corto plazo en las mujeres. EFECTOS SECUNDARIOS Y RIESGOS  Utilizar estrgeno slo sin progesterona causa que el recubrimiento del tero crezca. Esto aumenta el riesgo de cncer endometrial. El profesional que la asiste deber darle otra hormona llamada progestina, si usted tiene tero.  Las mujeres que realizan una TH combinada (estrgeno y progestina) parecen tener un mayor riesgo de sufrir cncer de mama. El riesgo parece ser [place], [place] aumenta a lo largo del tiempo que se realice la [place].  La terapia combinada tambin hace que el tejido mamario sea levemente ms denso, lo que hace que sea ms difcil leer mamografas (radiografas de mama).  Combinada, la terapia de estrgeno y Immunologist puede realizarse todos los das, en cuyo caso podrn Warehouse manager de [place]. La TH puede realizarse  de manera cclica, en cuyo caso tendr perodos menstruales.  La TH puede aumentar el riesgo de [place], ataque cardaco, cncer de mama y formacin de cogulos en la pierna. TRATAMIENTO  Si decide realizar Ardelia Mems TH y tiene tero,  normalmente se prescribe el uso de estrgeno y progestina.  El profesional que la asiste le ayudar a decidir la mejor forma de [employer].  Lo mejor es Chief Executive Officer dosis posible que pueda ayudarla con sus sntomas y tomarlos durante la menor cantidad de tiempo posible.  La terapia hormonal puede ayudar a Banker de los problemas (sntomas) que afectan a las mujeres durante la menopausia. Antes de tomar una decisin con respecto a la TH, converse con el profesional que la asiste acerca de qu es lo mejor para usted. Mantngase bien informada y sintase cmoda con sus decisiones. INSTRUCCIONES PARA EL CUIDADO DOMICILIARIO:  [place] indicaciones del profesional con respecto a cmo Biomedical scientist.  Hgase controles de [place] regular, e incluya [place] y [place]. SOLICITE ATENCIN MDICA DE INMEDIATO SI PRESENTA:  Hemorragia vaginal anormal.  Dolor o inflamacin en las piernas, falta de aliento o Tourist information centre manager.  Mareos o dolores de [name].  Protuberancias o cambios en sus mamas o axilas.  Pronunciacin inarticulada.  Debilidad o adormecimiento en los brazos o las piernas.  Dolor, ardor o sangrado al [employer].  Dolor abdominal. Document Released: 08/29/2007 Document Revised: 06/04/2011 ExitCare Patient Information 2015 [place], [place]. This information is not intended to replace advice given to you by your health care provider. Make sure you discuss any questions you have with your health care provider. Perimenopausia (Perimenopause) La perimenopausia es el momento en que su cuerpo comienza a pasar a la menopausia (sin menstruacin durante 12 meses consecutivos). Es un proceso natural. La perimenopausia puede comenzar entre 2 y 90 aos antes de la menopausia y por lo general tiene una duracin de 1 ao ms pasada la menopausia. [employer], los ovarios podran producir un vulo o no. Los ovarios varan su produccin de las hormonas  estrgeno y Technical brewer. Esto puede causar perodos menstruales irregulares, dificultad para quedar embarazada, hemorragia vaginal entre perodos y sntomas incmodos. CAUSAS  Produccin irregular de las hormonas ovricas estrgeno y Immunologist, y no ovular todos los meses.  Otras causas son:  Tumor de la glndula pituitaria.  Enfermedades que [employer] ovarios.  Radioterapia.  Quimioterapia.  Causas desconocidas.  Fumar mucho y abusar del consumo de alcohol puede llevar a que la perimenopausia aparezca antes. SIGNOS Y SNTOMAS   Acaloramiento.  Sudoracin nocturna.  Perodos menstruales irregulares.  Disminucin del deseo sexual.  Sequedad vaginal.  Dolores de cabeza.  Cambios en el estado de nimo.  Depresin.  Problemas de memoria.  Irritabilidad.  Cansancio.  Aumento de [place].  Problemas para quedar embarazada.  Prdida de clulas seas (osteoporosis).  Comienzo de endurecimiento de las arterias (aterosclerosis). DIAGNSTICO  El mdico realizar un diagnstico en funcin de su edad, historial de perodos menstruales y sntomas. Le realizarn un examen fsico para ver si hay algn cambio en su cuerpo, en especial en sus rganos reproductores. Las pruebas hormonales pueden ser o no tiles segn la cantidad de hormonas femeninas que produzca y [employer] produzca. Sin embargo, podrn Microbiologist pruebas hormonales para Statistician. TRATAMIENTO  En algunos casos, no se necesita tratamiento. La decisin acerca de qu tratamiento es necesario durante la perimenopausia deber realizarse en conjunto con su mdico segn cmo estn afectando los sntomas  a su estilo de vida. Existen varios tratamientos disponibles, como:  Risk manager cada sntoma individual con medicamentos especficos para ese sntoma.  Algunos medicamentos herbales pueden ayudar en sntomas especficos.  Psicoterapia.  Terapia grupal. INSTRUCCIONES PARA EL CUIDADO EN  EL HOGAR   Controle sus periodos menstruales (cundo ocurren, qu tan abundantes son, cunto tiempo pasa entre perodos, y cunto duran) como tambin sus sntomas y cundo comenzaron.  Tome slo medicamentos de venta libre o recetados, segn las indicaciones del mdico.  Duerma y descanse.  Haga actividad fsica.  Consuma una dieta que contenga calcio (bueno para los [place]) y productos derivados de la soja (actan como estrgenos).  No fume.  Evite las bebidas alcohlicas.  Tome los suplementos vitamnicos segn las indicaciones del mdico. En ciertos casos, puede ser de [place] tomar vitamina E.  Tome suplementos de calcio y vitamina D para ayudar a Publishing rights manager prdida sea.  En algunos casos la terapia de grupo podr ayudarla.  La acupuntura puede ser de ayuda en ciertos casos. SOLICITE ATENCIN MDICA SI:   Tiene preguntas acerca de sus sntomas.  Necesita ser derivada a un especialista (gineclogo, psiquiatra, o psiclogo). SOLICITE ATENCIN MDICA DE INMEDIATO SI:   Sufre una hemorragia vaginal abundante.  Su perodo menstrual dura ms de 8 das.  Sus perodos son recurrentes cada menos de [address].  Tiene hemorragias durante las [employer].  Est muy deprimido.  Siente dolor al [employer].  Siente dolor de cabeza intenso.  Tiene problemas de visin. Document Released: 03/12/2005 Document Revised: 12/31/2012 [facility] Patient Information 2015 [place], [place]. This information is not intended to replace advice given to you by your health care provider. Make sure you discuss any questions you have with your health care provider.

## 2013-10-30 NOTE — Progress Notes (Signed)
   The patient presented to the office today for ultrasound followup. Patient was seen in the office on May 1 to have her Nexplanon removed since it had been 3 years since it was placed. Patient also back in March had been complaining of right lower quadrant discomfort and heavy menstrual cycle. She had an ultrasound on the last office visit which demonstrated the following:  Uterus measured 8.6 x 5.7 x 4.4 cm with endometrial stripe of 9.3 mm. Last menstrual period started 5 days ago. 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 Left ovary was normal no fluid in the cul-de-sac   Patient had a normal CA 125 as well as CBC, comprehensive metabolic panel, TSH and urinalysis and lipid profile. She received Depo-Provera 150 mg IM and was scheduled to return in 3 months for followup on the right ovarian cyst. She is complaining of some anxiety since receiving Depo-Provera injection. Her Nexplanon was removed that day and she presents to the office today for followup on her ovarian cyst.  Ultrasound today: Uterus measures 7.1 x 4.7 x 4.2 cm with endometrial stripe of 4.6 mm. A single fundal fibroid measured 15 x 15 x 14 mm was noted both ovaries appeared to be normal. Patient had reported not having a menstrual cycle since June and is starting to have occasional hot flashes. Her urine pregnancy test today was negative and there were some yeast and her urine for which she was taking Diflucan.  Patient will return back to the office in one week to place a new Nexplanon for contraception. Literature and information on the perimenopause was provided in [place].

## 2013-11-01 LAB — URINE CULTURE
Colony Count: NO GROWTH
ORGANISM ID, BACTERIA: NO GROWTH

## 2013-11-02 ENCOUNTER — Other Ambulatory Visit: Payer: Self-pay | Admitting: Gynecology

## 2013-11-02 ENCOUNTER — Telehealth: Payer: Self-pay | Admitting: Gynecology

## 2013-11-02 DIAGNOSIS — Z3049 Encounter for surveillance of other contraceptives: Secondary | ICD-10-CM

## 2013-11-02 MED ORDER — LEVONORGESTREL 20 MCG/24HR IU IUD: INTRAUTERINE_SYSTEM | Freq: Once | INTRAUTERINE | Status: DC

## 2013-11-02 NOTE — Telephone Encounter (Signed)
11/02/13-Pt was advised today that her [facility] ins will cover the Nexplanon and insertion under her $35.00 copay. Appt is already made with JF/wl

## 2013-11-13 ENCOUNTER — Encounter: Payer: Self-pay | Admitting: Gynecology

## 2013-11-13 ENCOUNTER — Ambulatory Visit (INDEPENDENT_AMBULATORY_CARE_PROVIDER_SITE_OTHER): Payer: BC Managed Care – PPO | Admitting: Gynecology

## 2013-11-13 VITALS — BP 124/74

## 2013-11-13 DIAGNOSIS — Z30017 Encounter for initial prescription of implantable subdermal contraceptive: Secondary | ICD-10-CM

## 2013-11-13 DIAGNOSIS — N912 Amenorrhea, unspecified: Secondary | ICD-10-CM

## 2013-11-13 DIAGNOSIS — Z975 Presence of (intrauterine) contraceptive device: Secondary | ICD-10-CM | POA: Insufficient documentation

## 2013-11-13 LAB — PREGNANCY, URINE: Preg Test, Ur: NEGATIVE

## 2013-11-13 NOTE — Progress Notes (Signed)
   Patient presented to have the Nexplanon placed. She had one that expired earlier this year. She is perimenopausal. Patient has read the literature information provided before. Risk and benefits discussed in Spanish and all questions were answered.                                              Nexplanon Procedure Note (insertion)     The patient was laying on her back with her nondominant left  arm flexed at the elbow and externally rotated. The insertion site was identified as the underside of the nondominant upper arm approximately 8 cm from the medial epicondyle of the humerus. 2 marks were made with a sterile marker: The first marked the spot where the Nexplanon  implant was to be inserted, and a second, marked a spot a few centimeters proximal to the first marke to guide the direction of the insertion. The area was cleansed with Betadine solution. The area was anesthetized with 1% lidocaine  (1 cc)  at the area the injection site and underneath the skin along the planned insertion tunnel. The preloaded disposable Nexplanon was removed from its sterile casing.  The applicator was held above the needle at the textured surface area. The transparent protector was removed. With a freehand, the skin was stretched around the insertion site with a thumb and index finger. The skin was then punctured with the tip of the needle angled at 30. The Nexplanon applicator was lowered to a horizontal position. While lifting the skin with the tip of the needle the needle was then slid to its full length. The applicator was kept in sitting position with a needle inserted to its full length. The purple slider was unlocked by pushing it slightly downward. The slider was fully moved back until it stopped. This allowed the implant to be in the final subdermal position and the needle was locked inside the body of the applicator. The applicator was then removed. A Steri-Strip was made over the incision and a Kerlix wrap was  placed which patient is to remove tomorrow. No complications patient tolerated procedure well and was released home with instructions.   Nexplanon:  Lot No. [id number]  LMP in June. UPT negative  O8/07 and 08/21  [facility] HMD11:49 AMTD@  Note: This dictation was prepared with  Dragon/digital dictation along withSmart phrase technology. Any transcriptional errors that result from this process are unintentional.

## 2014-01-25 ENCOUNTER — Encounter: Payer: Self-pay | Admitting: Gynecology

## 2014-01-25 ENCOUNTER — Other Ambulatory Visit: Payer: Self-pay

## 2014-01-26 MED ORDER — ALPRAZOLAM 0.25 MG PO TABS: 0.2500 mg | ORAL_TABLET | Freq: Every evening | ORAL | Status: DC | PRN

## 2014-04-05 ENCOUNTER — Ambulatory Visit (INDEPENDENT_AMBULATORY_CARE_PROVIDER_SITE_OTHER): Payer: BC Managed Care – PPO | Admitting: Internal Medicine

## 2014-04-05 ENCOUNTER — Encounter (HOSPITAL_COMMUNITY): Payer: Self-pay

## 2014-04-05 ENCOUNTER — Ambulatory Visit (HOSPITAL_COMMUNITY)
Admission: RE | Admit: 2014-04-05 | Discharge: 2014-04-05 | Disposition: A | Discharge status: Home / Self Care | Payer: BC Managed Care – PPO | Source: Ambulatory Visit | Attending: Internal Medicine | Admitting: Internal Medicine

## 2014-04-05 VITALS — BP 104/64 | HR 81 | Temp 98.2°F | Resp 16 | Ht 61.0 in | Wt 173.2 lb

## 2014-04-05 DIAGNOSIS — R109 Unspecified abdominal pain: Secondary | ICD-10-CM

## 2014-04-05 DIAGNOSIS — D259 Leiomyoma of uterus, unspecified: Secondary | ICD-10-CM | POA: Diagnosis not present

## 2014-04-05 DIAGNOSIS — R102 Pelvic and perineal pain: Secondary | ICD-10-CM | POA: Insufficient documentation

## 2014-04-05 DIAGNOSIS — R1031 Right lower quadrant pain: Secondary | ICD-10-CM

## 2014-04-05 LAB — POCT UA - MICROSCOPIC ONLY
CRYSTALS, UR, HPF, POC: NEGATIVE
Casts, Ur, LPF, POC: NEGATIVE
MUCUS UA: NEGATIVE
Yeast, UA: NEGATIVE

## 2014-04-05 LAB — POCT URINALYSIS DIPSTICK
Bilirubin, UA: NEGATIVE
Blood, UA: NEGATIVE
Glucose, UA: NEGATIVE
KETONES UA: NEGATIVE
Nitrite, UA: NEGATIVE
PH UA: 6.5
PROTEIN UA: NEGATIVE
Spec Grav, UA: <=1.005
UROBILINOGEN UA: 0.2

## 2014-04-05 LAB — POCT CBC
GRANULOCYTE PERCENT: 69.6 % (ref 37–80)
HEMATOCRIT: 40.4 % (ref 37.7–47.9)
Hemoglobin: 13.8 g/dL (ref 12.2–16.2)
Lymph, poc: 2.3 (ref 0.6–3.4)
MCH: 32.2 pg — AB (ref 27–31.2)
MCHC: 34.1 g/dL (ref 31.8–35.4)
MCV: 94.4 fL (ref 80–97)
MID (cbc): 0.7 (ref 0–0.9)
MPV: 6.7 fL (ref 0–99.8)
PLATELET COUNT, POC: 250 10*3/uL (ref 142–424)
POC GRANULOCYTE: 6.9 (ref 2–6.9)
POC LYMPH PERCENT: 22.9 %L (ref 10–50)
POC MID %: 7.5 % (ref 0–12)
RBC: 4.28 M/uL (ref 4.04–5.48)
RDW, POC: 12.6 %
WBC: 9.9 10*3/uL (ref 4.6–10.2)

## 2014-04-05 LAB — POCT WET PREP WITH KOH
Clue Cells Wet Prep HPF POC: NEGATIVE
KOH Prep POC: NEGATIVE
TRICHOMONAS UA: NEGATIVE
YEAST WET PREP PER HPF POC: NEGATIVE

## 2014-04-05 LAB — POCT URINE PREGNANCY: PREG TEST UR: NEGATIVE

## 2014-04-05 MED ORDER — IOHEXOL 300 MG/ML  SOLN
50.0000 mL | Freq: Once | INTRAMUSCULAR | Status: AC | PRN
  Administered 2014-04-05: 50 mL via ORAL

## 2014-04-05 MED ORDER — IOHEXOL 300 MG/ML  SOLN
100.0000 mL | Freq: Once | INTRAMUSCULAR | Status: AC | PRN
  Administered 2014-04-05: 100 mL via INTRAVENOUS

## 2014-04-05 NOTE — Progress Notes (Signed)
   Subjective:    Patient ID: Linda Reese, female    DOB: March 20, 1961, 54 y.o.   MRN: [id number]  HPI    Review of Systems     Objective:   Physical Exam    Results for orders placed or performed in visit on 04/05/14  POCT CBC  Result Value Ref Range   WBC 9.9 4.6 - 10.2 K/uL   Lymph, poc 2.3 0.6 - 3.4   POC LYMPH PERCENT 22.9 10 - 50 %L   MID (cbc) 0.7 0 - 0.9   POC MID % 7.5 0 - 12 %M   POC Granulocyte 6.9 2 - 6.9   Granulocyte percent 69.6 37 - 80 %G   RBC 4.28 4.04 - 5.48 M/uL   Hemoglobin 13.8 12.2 - 16.2 g/dL   HCT, POC 40.4 37.7 - 47.9 %   MCV 94.4 80 - 97 fL   MCH, POC 32.2 (A) 27 - 31.2 pg   MCHC 34.1 31.8 - 35.4 g/dL   RDW, POC 12.6 %   Platelet Count, POC 250 142 - 424 K/uL   MPV 6.7 0 - 99.8 fL  POCT urinalysis dipstick  Result Value Ref Range   Color, UA yellow    Clarity, UA clear    Glucose, UA neg    Bilirubin, UA neg    Ketones, UA neg    Spec Grav, UA <=1.005    Blood, UA neg    pH, UA 6.5    Protein, UA neg    Urobilinogen, UA 0.2    Nitrite, UA neg    Leukocytes, UA Trace   POCT UA - Microscopic Only  Result Value Ref Range   WBC, Ur, HPF, POC 1-3    RBC, urine, microscopic 0-1    Bacteria, U Microscopic trace    Mucus, UA neg    Epithelial cells, urine per micros 1-3    Crystals, Ur, HPF, POC neg    Casts, Ur, LPF, POC neg    Yeast, UA neg   POCT urine pregnancy  Result Value Ref Range   Preg Test, Ur Negative   POCT Wet Prep with KOH  Result Value Ref Range   Trichomonas, UA Negative    Clue Cells Wet Prep HPF POC neg    Epithelial Wet Prep HPF POC 2-4    Yeast Wet Prep HPF POC neg    Bacteria Wet Prep HPF POC 1+    RBC Wet Prep HPF POC 0-1    WBC Wet Prep HPF POC 3-5    KOH Prep POC Negative      wbc iis noromal as is rest of cbc, no blood in the urine , preg test is neg, as is wet prep except for 1 bacteria. Clue cells neg. Assessment & Plan:

## 2014-04-05 NOTE — Patient Instructions (Signed)
Go directly to [facility] long hospital er for ct of abdomen/pelvis with contrast to eval for pain in rlq. Do not eat or drink until you are notified that you can after the ct is resulted.

## 2014-04-05 NOTE — Progress Notes (Signed)
Subjective:    Patient ID: Linda Reese, female    DOB: December 23, 1960, 54 y.o.   MRN: [id number]  HPI Chief complaint : Abdominal pain, back pain, pelvic pain  Onset on 12/23 while in [place] visiting family.   Traveled to [place] on 12/17 and She had acute onset Of diarhhea on 12/23 . This was associated with abdominal pain and pelvic area and in the  lower back. The  pain which seemed to be worse on the right.Pain is rated as 7/10 and is constant.  No fever, no vomiting, no blood in stool. The diarrhea resolved within one day but the pain continued and her sister who is a gastroenterologist in [place] gave her an injection for the pain on 12/18 and on 12/19. The pain still continued and she was started on a medication named DICETEL 100 mg which is mylar ( a ca antagonist. ) Since [place] the pain has increased over the past 2 days. She has some anorexia no diarrhea no fever. The pain is still increased in the right lower abdomen and in the back and also seems to radiate into her groin leg on the right. It is worse with movement . She has no dysuria or vag discharge.  She is sex active and has an implanaon . Her gyn is dr Jeremy Johann at [facility]. G2P2  She has a yearly pap but has never had a colonoscopy.She had an abdominal US of the gallbladder when she was in [place] which was reported to her as negiitve.  Review of Systems  Constitutional: Positive for appetite change and fatigue. Negative for fever.  HENT: Negative.   Eyes: Negative.   Respiratory: Negative.   Cardiovascular: Negative.   Gastrointestinal: Positive for abdominal pain, abdominal distention and rectal pain. Negative for vomiting, diarrhea, constipation and blood in stool.  Endocrine: Negative.   Genitourinary: Negative.   Musculoskeletal: Negative.   Skin: Negative.   Allergic/Immunologic: Negative.   Neurological: Negative.   Hematological: Negative.   Psychiatric/Behavioral:  Negative.   All other systems reviewed and are negative.      Objective:   Physical Exam  Constitutional: She appears well-developed and well-nourished.  HENT:  Head: Normocephalic and atraumatic.  Right Ear: External ear normal.  Left Ear: External ear normal.  Nose: Nose normal.  Mouth/Throat: Oropharynx is clear and moist.  Eyes: Conjunctivae and EOM are normal. Pupils are equal, round, and reactive to light.  Neck: Normal range of motion. Neck supple.  Cardiovascular: Normal rate, regular rhythm, normal heart sounds and intact distal pulses.   Pulmonary/Chest: Effort normal and breath sounds normal.  Abdominal: She exhibits distension. She exhibits no mass. There is tenderness. There is rebound and guarding.  Pain is increased with guarding in the rlq  Genitourinary: Vagina normal. No vaginal discharge found.  Cervical erythema small prolapse of the bladder easily reducible  Musculoskeletal: Normal range of motion.  Neurological: She is alert.  Skin: Skin is warm and dry.  Psychiatric: She has a normal mood and affect. Her behavior is normal. Judgment and thought content normal.  Nursing note and vitals reviewed.         Assessment & Plan:  54 year old lady with recent travel to [place] with abdominal pain increased on the right with guarding on exam. CBC ua preg dna gc and chlamydia and wt prep now and will get stat ct of abdomen and pelvis with contrast ot eval  etiology of pain and determine if appendicitis.

## 2014-04-07 LAB — GC/CHLAMYDIA PROBE AMP
CT Probe RNA: NEGATIVE
GC Probe RNA: NEGATIVE

## 2014-04-27 ENCOUNTER — Encounter: Payer: Self-pay | Admitting: Gynecology

## 2014-04-27 ENCOUNTER — Ambulatory Visit (INDEPENDENT_AMBULATORY_CARE_PROVIDER_SITE_OTHER): Payer: BC Managed Care – PPO | Admitting: Gynecology

## 2014-04-27 ENCOUNTER — Ambulatory Visit: Payer: BC Managed Care – PPO | Admitting: Gynecology

## 2014-04-27 VITALS — BP 112/70 | Ht 61.5 in | Wt 176.0 lb

## 2014-04-27 DIAGNOSIS — F419 Anxiety disorder, unspecified: Secondary | ICD-10-CM

## 2014-04-27 DIAGNOSIS — E038 Other specified hypothyroidism: Secondary | ICD-10-CM | POA: Insufficient documentation

## 2014-04-27 MED ORDER — ALPRAZOLAM 0.25 MG PO TABS: 0.2500 mg | ORAL_TABLET | Freq: Every evening | ORAL | Status: DC | PRN

## 2014-04-27 MED ORDER — LEVOTHYROXINE SODIUM 50 MCG PO TABS
50.0000 ug | ORAL_TABLET | Freq: Every day | ORAL | Status: DC
Start: 2014-04-27 — End: 2014-06-11

## 2014-04-27 NOTE — Progress Notes (Signed)
   Patient is a 54 year old who presented to the office today as a result of lab test done prior to her scheduled colonoscopy later this month whereby her TSH was found to be elevated with a value of 8.690. Patient has complained of weight gain joint discomfort and occasional sweating. She had also been seen in January of this year in the family practice clinic because of abdominal pain and back discomfort and had an abdominal pelvic CT scan which essentially unremarkable with the exception of a small 1 cm uterine fibroid. She also has been under a lot of stress has complained of anxiety and in the past that she is taking Xanax 0.25 mg daily when necessary. The remainder of the labs that she brought with her included a comprehensive metabolic panel and CBC which were all in the normal range.  We discussed her recent CT scan. We went over her labs. We discussed hypothyroidism. Patient has a history of hypothyroidism several years ago and had been on Synthroid but has been off of it for quite some time. We are going to initiate Synthroid 50 g daily and have her return to the office in 4-6 months for a TSH and to see the response from initiating her Synthroid. She scheduled to return back in May for her annual gynecological exam. For anxiety she was given a prescription of Xanax 0.25 mg take 1 by mouth daily when necessary.

## 2014-04-27 NOTE — Patient Instructions (Addendum)
Levothyroxine tablets Qu es este medicamento? La LEVOTIROXINA es una hormona tiroidea. Este medicamento puede [employer] sntomas de deficiencia tiroidea, tales como hablar con lentitud, falta de energa, aumento de [place], cada del cabello, piel seca y sensibilidad inusual al fro. Tambin sirve para tratar una enfermedad llamada bocio (la dilatacin de la glndula tiroides). Se utiliza tambin para tratar algunos tipos de cncer tiroideo junto con la [place] y otros medicamentos. Este medicamento puede ser utilizado para otros usos; si tiene alguna pregunta consulte con su proveedor de atencin mdica o con su farmacutico. MARCAS COMERCIALES DISPONIBLES: Reino Bellis, Levo-T, Levothroid, Levoxyl, Synthroid, Thyro-Tabs, Unithroid [employer] debo informar a mi profesional de la salud antes de tomar este medicamento? Necesita saber si usted presenta alguno de los [employer] o situaciones: -angina -problemas de coagulacin -diabetes -si est haciendo dieta o participa en un programa para bajar de peso -problemas de fertilidad -enfermedad cardiaca -altos niveles de la hormona tiroidea -problema de la glndula pituitaria -ataque cardaco previo -una reaccin alrgica o inusual a la levotiroxina, otras hormonas tiroideas, a otros medicamentos, alimentos, colorantes o conservantes -si est embarazada o buscando quedar embarazada -si est amamantando a un beb Cmo debo utilizar este medicamento? [employer] medicamento por va oral con agua abundante. Es mejor tomar este medicamento con el estmago vaco por lo menos 30 minutos antes o 2 horas despus de una comida. Siga las instrucciones de la etiqueta del [place]. [place] a la [employer] Corporation. No tome su medicamento con una frecuencia mayor que la indicada. Hable con su pediatra para informarse acerca del uso de este medicamento en nios. Aunque este medicamento ha sido recetado a nios y bebs tan menores como unos pocos das de edad  para condiciones selectivas, las precauciones se aplican. Para los bebs, puede triturar la tableta y mezclarla con una pequea cantidad (5-10 ml o 1 a 2 cucharaditas) de agua, leche materna o formula del lactante sin soya. No lo mezcle con formula del lactante a base de soya. Sobredosis: Pngase en contacto inmediatamente con un centro toxicolgico o una sala de urgencia si usted cree que haya tomado demasiado medicamento. ATENCIN: [employer] es solo para usted. No comparta este medicamento con nadie. Qu sucede si me olvido de una dosis? Si olvida una dosis, tmela lo antes posible. Si es casi la hora de la prxima dosis, tome slo esa dosis. No tome dosis adicionales o dobles. Qu puede interactuar con este medicamento? -amiodarona -anticidos -medicamentos antitiroideos -suplementos de calcio -carbamazepina -colestiramina -colestipol -digoxina -hormonas femeninas, incluyendo el contraconceptivo o las pldoras anticonceptivas -suplementos de hierro -Personal assistant -productos lquidos de nutricin, como Ensure -medicamentos para resfros y Primary school teacher -medicamentos para la diabetes -medicamentos para la depresin mental -medicamentos o productos a base de hierbas para bajar de peso o reducir el apetito -fenobarbital u otros barbitricos -fenitona -prednisona u otros corticosteroides -rifabutina -rifampicina -isoflavonas de soya -sucralfato -teofilina -warfarina Puede ser que esta lista no menciona todas las posibles interacciones. Informe a su profesional de KB Home	[place] de [employer] Corporation productos a base de hierbas, medicamentos de [place] o suplementos nutritivos que est tomando. Si usted fuma, consume bebidas alcohlicas o si utiliza drogas ilegales, indqueselo tambin a su profesional de KB Home	[place]. Algunas sustancias pueden interactuar con su medicamento. A qu debo estar atento al usar Coca-Cola? Asegrese de tomar Coca-Cola con lquidos en  abundancia. Ciertas marcas de las tabletas pueden provocar asfixia, arcadas o dificultad al tragar a caso de que la tableta se le  puede quedar atascada en la garganta. La mayora de estos problemas se desaparecen cuando tome el medicamento con la cantidad [place] de agua u otros lquidos. No cambie de marca de Coca-Cola a menos que su profesional de la salud est de acuerdo. Consulte con su profesional de la salud si no est seguro. Necesitar realizarse [employer] regularmente y [place] de sangre de [place] en cuando para evaluar su respuesta al [place]. Si est [employer] Corporation para el hipotiroidismo, pueden transcurrir varias semanas antes de que observe [employer]. Consulte con su mdico o con su profesional de la salud si sus sntomas no mejoran. Tal vez deba usar este medicamento de por vida. No deje de usarlo a menos que su mdico o su profesional de KB Home	[place] as lo indique. Este medicamento puede afectar su nivel de Dispensing optician. Si tiene diabetes, controle su nivel de azcar como se le haya indicado. Es posible que se le caiga un poco el cabello al comenzar [place]. Por lo general este problema se resuelve solo. Si va a someterse a una operacin, informe a su mdico o a su profesional de la salud que est tomando Coca-Cola. Qu efectos secundarios puedo tener al [employer] este medicamento? Efectos secundarios que debe informar a su mdico o a Barrister's clerk de la salud tan pronto como sea posible: -Chief of Staff como erupcin cutnea, picazn o urticarias, hinchazn de la cara, labios o lengua -dolor en el pecho -sudoracin excesiva o intolerancia al calor -pulso cardiaco rpido o irregular -nerviosismo -erupcin cutnea o urticaria -hinchazn de tobillos, pies o piernas -temblores Efectos secundarios que, por lo general, no requieren atencin mdica (debe informarlos a su mdico o a su profesional de la salud si persisten o si son  molestos): -cambios en el apetito -cambios en los perodos menstruales -diarrea -cada del cabello -dolor de cabeza -problemas para dormir -prdida de peso Puede ser que esta lista no menciona todos los posibles efectos secundarios. Comunquese a su mdico por asesoramiento mdico [employer]. Usted puede informar los efectos secundarios a la FDA por telfono al 1-800-FDA-1088. Dnde debo guardar mi medicina? Mantngala fuera del alcance de los nios. Gurdela a [employer], entre 15 y 47 grados C (36 y 47 grados F). Protjala de la luz y de la humedad. Mantenga el envase bien cerrado. Deseche todo el medicamento que no haya utilizado, despus de la fecha de vencimiento. ATENCIN: Este folleto es un resumen. Puede ser que no cubra toda la posible informacin. Si usted tiene preguntas acerca de esta medicina, consulte con su mdico, su farmacutico o su profesional de Technical sales engineer.  2015, Elsevier/Gold Standard. (2008-08-13 17:07:22) Hipotiroidismo (Hypothyroidism) La tiroides es una glndula grande ubicada en la parte anterior e inferior del cuello. La glndula tiroides interviene en el control del metabolismo. El metabolismo es el modo en que el organismo utiliza los alimentos. El control del metabolismo se realiza a travs de una hormona denominada tiroxina. Cuando la actividad de esta glndula est por debajo de lo normal (hipotiroidismo) produce muy poca cantidad de hormona. CAUSAS Aqu se incluyen:   Ausencia de tejido tiroideo.  Bocio por dficit de yodo.  Bocio por medicamentos.  Defectos congnitos (desde el nacimiento).  Trastornos de la glndula pituitaria Esto ocasiona la falta de TSH (sigla que significa hormona estimulante de la tiroides) Esta hormona le informa a la tiroides que debe producir ms hormona. SNTOMAS  Letargia (sentir que no se tiene Teacher, early years/pre)  Intolerancia al fro  [employer] de  peso (a pesar de una ingesta normal de  alimentos)  Piel seca  Cabello seco  Irregularidades menstruales  Enlentecimiento de los procesos de pensamiento La insuficiente cantidad de hormona tiroidea tambin puede ocasionar problemas cardacos. El hipotiroidismo en el recin nacido es el cretinismo en su forma extrema. Es importante que esta forma se trate de modo adecuado e inmediato, ya que puede conducir rpidamente al retardo del desarrollo fsico y mental. DIAGNSTICO Para comprobar la existencia de hipotiroidismo, Mining engineer anlisis de sangre y radiografas y estudios con [place]. Muchas veces los signos estn ocultos. Es necesario que el profesional vigile la enfermedad con anlisis de [place]. Esto se realiza luego de Electrical engineer diagnstico (determinar cul es el problema). Puede ser necesario que el profesional que lo asiste controle esta enfermedad con anlisis de sangre ya sea antes o despus del diagnstico y [place]. TRATAMIENTO Los niveles bajos de hormona tiroidea se incrementan con el uso de hormona tiroidea sinttica. Este es un tratamiento seguro y [place]. Se dispone de hormona tiroidea sinttica para el tratamiento de este trastorno. Generalmente lleva algunas semanas obtener el efecto total de los medicamentos. Luego de obtener el efecto completo del [place], habitualmente pasan otras cuatro semanas para que los sntomas empiezan a Armed forces operational officer. El profesional podr comenzar indicndole dosis bajas. Si usted tuvo problemas cardacos, la dosis se aumentar de manera gradual. Podr volver a lo normal sin Firefighter una situacin de emergencia. [place] los [employer] ha indicado el profesional que lo asiste. Infrmele al profesional todos los medicamentos que toma o que ha comenzado a Radio producer. El profesional que lo asiste lo ayudar con los esquemas de las dosis.  A medida que obtiene mejora, es necesario aumentar la dosis. Ser necesario  Optometrist continuos anlisis de [place], segn lo indique el profesional.  Informe acerca de todos los efectos secundarios que sospeche que podran deberse a los medicamentos. SOLICITE ATENCIN MDICA SI: Solicite atencin mdica si observa:  Sudoracin.  Temblores.  Ansiedad.  Rpida prdida de peso.  Intolerancia al calor.  Cambios emocionales.  Diarrea.  Debilidad. SOLICITE ATENCIN MDICA DE INMEDIATO SI: Presenta dolor en el pecho, una frecuencia cardaca irregular (palpitaciones) o latidos cardacos rpidos. EST SEGURO QUE:   Comprende las instrucciones para el alta mdica.  Controlar su enfermedad.  Solicitar atencin mdica de inmediato segn las indicaciones. Document Released: 03/12/2005 Document Revised: 06/04/2011 [facility] Patient Information 2015 [place]. This information is not intended to replace advice given to you by your health care provider. Make sure you discuss any questions you have with your health care provider. Alprazolam tablets Qu es este medicamento? El ALPRAZOLAM es una benzodiacepina. Se utiliza para tratar la ansiedad y los ataques de pnico. [place] medicamento puede ser utilizado para otros usos; si tiene alguna pregunta consulte con su proveedor de atencin mdica o con su farmacutico. MARCAS COMERCIALES DISPONIBLES: Xanax Qu le debo informar a mi profesional de la salud antes de tomar este medicamento? Necesita saber si usted presenta alguno de los siguientes problemas o situaciones: -problema de alcoholismo o drogadiccin -trastorno bipolar, depresin, psicosis u otros problemas de salud mental -glaucoma -enfermedad renal o heptica -enfermedad pulmonar o respiratoria -miastenia gravis -enfermedad de Parkinson -porfiria -convulsiones o antecedentes de convulsiones -ideas suicidas -una reaccin alrgica o inusual al alprazolam, a otras benzodiacepinas, alimentos, colorantes o conservantes -si est embarazada o buscando  quedar embarazada -si est amamantando a un beb Cmo debo utilizar este medicamento? [employer] por  va oral con un vaso de agua. Siga las instrucciones de la etiqueta del [place]. Tome sus dosis a intervalos regulares. No tome su medicamento con una frecuencia mayor a la indicada. Si ha venido tomando Coca-Cola de [place] regular durante algn tiempo, no deje de tomarlo repentinamente. Debe reducir gradualmente la dosis para no sufrir efectos secundarios severos. Consulte a su mdico o a su profesional de la salud por asesoramiento. Aun despus de dejar de tomarlo, los efectos del medicamento en su cuerpo pueden perdurar [employer]. Hable con su pediatra para informarse acerca del uso de este medicamento en nios. Puede requerir atencin especial. Sobredosis: Pngase en contacto inmediatamente con un centro toxicolgico o una sala de urgencia si usted cree que haya tomado demasiado medicamento. ATENCIN: [employer] es solo para usted. No comparta este medicamento con nadie. Qu sucede si me olvido de una dosis? Si olvida una dosis, tmela lo antes posible. Si es casi la hora de la prxima dosis, tome slo esa dosis. No tome dosis adicionales o dobles. Qu puede interactuar con este medicamento? No tome esta medicina junto con ninguno de los siguientes medicamentos: -algunos medicamentos para la infeccin por VIH o SIDA -quetoconazol -itraconazol Esta medicina tambin puede interactuar con los siguientes medicamentos: -pldoras anticonceptivas -algunos antibiticos macrlidos, tales como claritromicina, eritromicina o troleandomicina -cimetidina -ciclosporina -ergotamina -jugo de toronja -suplementos dietticos o a base de hierbas, como kava kava, melatonina, dehidroepiandrosterona, DHEA, hierba de [place] o valeriana -imatinib, STI-571 -isoniazida -levodopa -medicamentos para la depresin, ansiedad o trastornos psicticos -analgsicos  recetados -rifampicina, rifapentina o rifabutina -algunos medicamentos para la presin sangunea o problemas cardiacos -algunos medicamentos para las convulsiones, tales como [place], [place], [place], Museum/gallery curator o primidona Puede ser que esta lista no menciona todas las posibles interacciones. Informe a su profesional de KB Home	[place] de [employer] Corporation productos a base de hierbas, medicamentos de [place] o suplementos nutritivos que est tomando. Si usted fuma, consume bebidas alcohlicas o si utiliza drogas ilegales, indqueselo tambin a su profesional de KB Home	[place]. Algunas sustancias pueden interactuar con su medicamento. A qu debo estar atento al usar Coca-Cola? Visite a su mdico o a su profesional de la salud para chequear su evolucin peridicamente. Su cuerpo puede hacerse dependiente del medicamento. Por esta razn, pregunte a su mdico o a su profesional de la salud si todava necesita tomarlo. Puede experimentar somnolencia o mareos. No conduzca ni utilice maquinaria, ni haga nada que Associate Professor en estado de alerta hasta que sepa cmo le afecta este medicamento. Para reducir el riesgo de mareos o [place], no se ponga de pie ni se siente con rapidez, especialmente si es un paciente de edad avanzada. El alcohol puede aumentar su somnolencia y [place]. Evite consumir bebidas alcohlicas. No se trate usted mismo si tiene tos, resfro o Set designer sin Teacher, adult education a su mdico o a su profesional de Technical sales engineer. Algunos ingredientes pueden aumentar los posibles efectos secundarios. Qu efectos secundarios puedo tener al [employer] este medicamento? Efectos secundarios que debe informar a su mdico o a Barrister's clerk de la salud tan pronto como sea posible: -Chief of Staff como erupcin cutnea, picazn o urticarias, hinchazn de la cara, labios o lengua -confusin, tendencia a olvidar -depresin -dificultad para conciliar el sueo -dificultad para hablar -sensacin de  desmayos o mareos, cadas -cambios de humor, excitabilidad o comportamiento agresivo -calambres musculares -dificultad para orinar o cambios en el volumen de orina -cansancio o debilidad inusual Efectos secundarios que, por lo general, no requieren Geophysical data processor (  debe informarlos a su mdico o a su profesional de la salud si persisten o si son molestos): -cambios en el deseo sexual o capacidad -cambios del apetito Puede ser que esta lista no menciona todos los posibles efectos secundarios. Comunquese a su mdico por asesoramiento mdico [employer]. Usted puede informar los efectos secundarios a la FDA por telfono al 1-800-FDA-1088. Dnde debo guardar mi medicina? Mantngala fuera del alcance de los nios. Este medicamento puede ser abusado. Mantenga su medicamento en un lugar seguro para protegerlo contra robos. No comparta este medicamento con nadie. Es peligroso vender o Associate Professor y est prohibido por la ley. Gurdela a [employer], entre 20 y 53 grados C (95 y 102 grados F). Deseche todo el medicamento que no haya utilizado, despus de la fecha de vencimiento. ATENCIN: Este folleto es un resumen. Puede ser que no cubra toda la posible informacin. Si usted tiene preguntas acerca de esta medicina, consulte con su mdico, su farmacutico o su profesional de Technical sales engineer.  2015, Elsevier/Gold Standard. 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 Interfiere en las funciones vitales, incluyendo las [place], el trabajo y la escuela.  Es diferente de la ansiedad normal que todas las personas experimentan en algn momento de su vida en respuesta a sucesos y Chief Executive Officer. En verdad, la ansiedad normal nos ayuda a prepararnos y Brewing technologist acontecimientos y actividades de la vida. La ansiedad normal desaparece despus de que el evento o la  actividad ha finalizado.  El trastorno de ansiedad generalizada no est necesariamente relacionada con eventos o actividades especficas. Tambin causa un exceso de ansiedad en proporcin a sucesos o actividades especficas. En este trastorno la ansiedad es difcil de Chief Technology Officer. Los sntomas pueden variar de leves a muy graves. Las personas que sufren de trastorno de ansiedad generalizada pueden tener intensas olas de ansiedad con sntomas fsicos (ataques de pnico).  SNTOMAS  La ansiedad y la preocupacin asociada a este trastorno son difciles de Chief Technology Officer. Esta ansiedad y la preocupacin estn relacionados con muchos eventos de la vida y sus actividades y tambin ocurre durante ms [employer] que no ocurre, durante 6 meses o ms. Las personas que la sufren pueden tener tres o ms de los siguientes sntomas (uno o ms en los nios):   Customer service manager.  Dificultades de concentracin.   Irritabilidad.  Tensin muscular  Dificultad para dormirse o sueo poco satisfactorio. DIAGNSTICO  Se diagnostica a travs de una evaluacin realizada por el mdico. El mdico le har preguntas acerca de su estado de nimo, sntomas fsicos y sucesos de [place] vida. Le har preguntas sobre su historia clnica, el consumo de alcohol o drogas, incluyendo los medicamentos recetados. [employer] un examen fsico e indicar anlisis de [place]. Ciertas enfermedades y el uso de determinadas sustancias pueden causar sntomas similares a este trastorno. Su mdico lo puede derivar a Teaching laboratory technician en salud mental para una evaluacin ms profunda.Belva Crome  Las terapias siguientes se utilizan en el tratamiento de este trastorno:   Medicamentos - Se recetan antidepresivos para el control diario a Barrister's clerk. Pueden indicarse tambin medicamentos para combatir la [place] graves, especialmente cuando ocurren ataques de pnico.   Terapia conversada (psicoterapia) Ciertos tipos de psicoterapia  pueden ser tiles en el tratamiento del trastorno de ansiedad generalizada, proporcionando apoyo, educacin y Fish farm manager. Una forma de psicoterapia llamada terapia cognitivo-conductual puede ensearle formas saludables  de pensar y Firefighter a los eventos y actividades de la vida diaria.  Tcnicasde manejo del estrs- Estas tcnicas incluyen el yoga, la meditacin y el ejercicio y pueden ser muy tiles cuando se practican con regularidad. Un especialista en salud mental puede ayudar a determinar qu tratamiento es mejor para usted. Algunas personas obtienen mejora con una terapia. Sin embargo, Producer, television/film/video requieren una combinacin de terapias.  Document Released: 07/07/2012 Document Revised: 07/27/2013 [facility] Patient Information 2015 [place]. This information is not intended to replace advice given to you by your health care provider. Make sure you discuss any questions you have with your health care provider.

## 2014-06-07 ENCOUNTER — Encounter: Payer: BC Managed Care – PPO | Admitting: Gynecology

## 2014-06-10 ENCOUNTER — Encounter: Payer: Self-pay | Admitting: Gynecology

## 2014-06-10 ENCOUNTER — Other Ambulatory Visit (HOSPITAL_COMMUNITY)
Admission: RE | Admit: 2014-06-10 | Discharge: 2014-06-10 | Disposition: A | Discharge status: Home / Self Care | Payer: BC Managed Care – PPO | Source: Ambulatory Visit | Attending: Gynecology | Admitting: Gynecology

## 2014-06-10 ENCOUNTER — Ambulatory Visit (INDEPENDENT_AMBULATORY_CARE_PROVIDER_SITE_OTHER): Payer: BC Managed Care – PPO | Admitting: Gynecology

## 2014-06-10 VITALS — BP 110/70 | Ht 61.25 in | Wt 172.0 lb

## 2014-06-10 DIAGNOSIS — R0981 Nasal congestion: Secondary | ICD-10-CM | POA: Diagnosis not present

## 2014-06-10 DIAGNOSIS — Z01411 Encounter for gynecological examination (general) (routine) with abnormal findings: Secondary | ICD-10-CM | POA: Insufficient documentation

## 2014-06-10 DIAGNOSIS — Z1159 Encounter for screening for other viral diseases: Secondary | ICD-10-CM | POA: Diagnosis not present

## 2014-06-10 DIAGNOSIS — E039 Hypothyroidism, unspecified: Secondary | ICD-10-CM

## 2014-06-10 DIAGNOSIS — Z01419 Encounter for gynecological examination (general) (routine) without abnormal findings: Secondary | ICD-10-CM | POA: Diagnosis not present

## 2014-06-10 DIAGNOSIS — M79629 Pain in unspecified upper arm: Secondary | ICD-10-CM

## 2014-06-10 DIAGNOSIS — M25529 Pain in unspecified elbow: Secondary | ICD-10-CM

## 2014-06-10 DIAGNOSIS — L568 Other specified acute skin changes due to ultraviolet radiation: Secondary | ICD-10-CM

## 2014-06-10 DIAGNOSIS — R635 Abnormal weight gain: Secondary | ICD-10-CM | POA: Diagnosis not present

## 2014-06-10 DIAGNOSIS — M25579 Pain in unspecified ankle and joints of unspecified foot: Secondary | ICD-10-CM | POA: Diagnosis not present

## 2014-06-10 DIAGNOSIS — Z1151 Encounter for screening for human papillomavirus (HPV): Secondary | ICD-10-CM | POA: Insufficient documentation

## 2014-06-10 LAB — COMPREHENSIVE METABOLIC PANEL
ALBUMIN: 4.3 g/dL (ref 3.5–5.2)
ALK PHOS: 83 U/L (ref 39–117)
ALT: 22 U/L (ref 0–35)
AST: 20 U/L (ref 0–37)
BUN: 15 mg/dL (ref 6–23)
CHLORIDE: 106 meq/L (ref 96–112)
CO2: 24 meq/L (ref 19–32)
Calcium: 9.3 mg/dL (ref 8.4–10.5)
Creat: 0.78 mg/dL (ref 0.50–1.10)
Glucose, Bld: 92 mg/dL (ref 70–99)
Potassium: 4.7 mEq/L (ref 3.5–5.3)
SODIUM: 140 meq/L (ref 135–145)
Total Bilirubin: 0.6 mg/dL (ref 0.2–1.2)
Total Protein: 7 g/dL (ref 6.0–8.3)

## 2014-06-10 LAB — HEPATITIS C ANTIBODY: HCV Ab: NEGATIVE

## 2014-06-10 LAB — CBC WITH DIFFERENTIAL/PLATELET
Basophils Absolute: 0 10*3/uL (ref 0.0–0.1)
Basophils Relative: 0 % (ref 0–1)
Eosinophils Absolute: 0.2 10*3/uL (ref 0.0–0.7)
Eosinophils Relative: 3 % (ref 0–5)
HCT: 40.2 % (ref 36.0–46.0)
Hemoglobin: 13.7 g/dL (ref 12.0–15.0)
Lymphocytes Relative: 29 % (ref 12–46)
Lymphs Abs: 1.8 10*3/uL (ref 0.7–4.0)
MCH: 31.7 pg (ref 26.0–34.0)
MCHC: 34.1 g/dL (ref 30.0–36.0)
MCV: 93.1 fL (ref 78.0–100.0)
MPV: 9.5 fL (ref 8.6–12.4)
Monocytes Absolute: 0.4 10*3/uL (ref 0.1–1.0)
Monocytes Relative: 7 % (ref 3–12)
Neutro Abs: 3.7 10*3/uL (ref 1.7–7.7)
Neutrophils Relative %: 61 % (ref 43–77)
Platelets: 250 10*3/uL (ref 150–400)
RBC: 4.32 MIL/uL (ref 3.87–5.11)
RDW: 13.3 % (ref 11.5–15.5)
WBC: 6.1 10*3/uL (ref 4.0–10.5)

## 2014-06-10 LAB — RHEUMATOID FACTOR: Rhuematoid fact SerPl-aCnc: <10 IU/mL (ref <=14)

## 2014-06-10 LAB — LIPID PANEL
CHOL/HDL RATIO: 4.5 ratio
Cholesterol: 147 mg/dL (ref 0–200)
HDL: 33 mg/dL — ABNORMAL LOW (ref >=46)
LDL Cholesterol: 96 mg/dL (ref 0–99)
TRIGLYCERIDES: 90 mg/dL (ref <150)
VLDL: 18 mg/dL (ref 0–40)

## 2014-06-10 LAB — TSH: TSH: 3.755 u[IU]/mL (ref 0.350–4.500)

## 2014-06-10 NOTE — Addendum Note (Signed)
Addended by: Thurnell Garbe A on: 06/10/2014 10:39 AM   Modules accepted: Orders, SmartSet

## 2014-06-10 NOTE — Progress Notes (Signed)
Linda Reese 11-27-1960 [id number]   History:    54 y.o.  for annual gyn exam with several complaints today. #1 patient complaining of postnasal drip sinus congestion and dry cough for past few days. #2 patient with past history of hypothyroidism had been off the medication for several years and when she was seen on February 2 a TSH was drawn before her colonoscopy in January and was found to be elevated with a value of 8.96 #3 weight gain #4 photosensitivity and joint pains  Patient with no past history of any abnormal Pap smear. Colonoscopy this year was normal. Her mammogram is overdue last was in 2014. Patient has informed me that her father has history of lupus. Patient has a Nexplanon that was placed in 2015. She is perimenopausal as noted by [facility] last year being slightly elevated.  Past medical history,surgical history, family history and social history were all reviewed and documented in the EPIC chart.  Gynecologic History No LMP recorded. Patient has had an implant. Contraception: Nexplanon Last Pap: 2013. Results were: normal Last mammogram: 2014. Results were: normal  Obstetric History OB History  Gravida Para Term Preterm AB SAB TAB Ectopic Multiple Living  2 2 2       2     # Outcome Date GA Lbr Len/2nd Weight Sex Delivery Anes PTL Lv  2 Term     F Vag-Spont  N Y  1 Term     F Vag-Spont  N Y       ROS: A ROS was performed and pertinent positives and negatives are included in the history.  GENERAL: No fevers or chills. HEENT: No change in vision, no earache, sore throat or sinus congestion. NECK: No pain or stiffness. CARDIOVASCULAR: No chest pain or pressure. No palpitations. PULMONARY: No shortness of breath, cough or wheeze. GASTROINTESTINAL: No abdominal pain, nausea, vomiting or diarrhea, melena or bright red blood per rectum. GENITOURINARY: No urinary frequency, urgency, hesitancy or dysuria. MUSCULOSKELETAL: No joint or muscle pain, no back pain, no  recent trauma. DERMATOLOGIC: No rash, no itching, no lesions. ENDOCRINE: No polyuria, polydipsia, no heat or cold intolerance. No recent change in weight. HEMATOLOGICAL: No anemia or easy bruising or bleeding. NEUROLOGIC: No headache, seizures, numbness, tingling or weakness. PSYCHIATRIC: No depression, no loss of interest in normal activity or change in sleep pattern.     Exam: chaperone present  BP 110/70 mmHg  Ht 5' 1.25" (1.556 m)  Wt 172 lb (78.019 kg)  BMI 32.22 kg/m2  Body mass index is 32.22 kg/(m^2).  General appearance : Well developed well nourished female. No acute distress HEENT: Eyes: no retinal hemorrhage or exudates,  Neck supple, trachea midline, no carotid bruits, no thyroidmegaly Lungs: Clear to auscultation, no rhonchi or wheezes, or rib retractions  Heart: Regular rate and rhythm, no murmurs or gallops Breast:Examined in sitting and supine position were symmetrical in appearance, no palpable masses or tenderness,  no skin retraction, no nipple inversion, no nipple discharge, no skin discoloration, no axillary or supraclavicular lymphadenopathy Abdomen: no palpable masses or tenderness, no rebound or guarding Extremities: no edema or skin discoloration or tenderness  Pelvic:  Bartholin, Urethra, Skene Glands: Within normal limits             Vagina: No gross lesions or discharge  Cervix: No gross lesions or discharge  Uterus  anteverted, normal size, shape and consistency, non-tender and mobile  Adnexa  Without masses or tenderness  Anus and perineum  normal  Rectovaginal  normal sphincter tone without palpated masses or tenderness             Hemoccult recent colonoscopy one month ago normal     Assessment/Plan:  54 y.o. female for annual exam with photosensitivity and joint pains. Patient also has a father with lupus. We are going to check a rheumatoid factor as well as anti-nuclear antibody's. I'm going to refer her also to the rheumatologist for further  evaluation. It appears that she has a viral seasonal congestion for which she's going to take Zyrtec when necessary. A TSH will be drawn today to make sure that her thyroid levels are back to normal. Other screening labs as follows: CBC, comprehensive metabolic panel, lipid profile, and urinalysis.  New CDC guidelines is recommending patients be tested once in her lifetime for hepatitis C antibody who were born between [birth year] through 1963. This was discussed with the patient today and has agreed to be tested today.  She currently takes Xanax 0.25 mg when necessary anxiety. She was reminded do her monthly breast exams. She was provided with requisition to schedule her overdue mammogram. Pap smear was done today. We discussed importance of calcium vitamin D and regular exercise for osteoporosis prevention. Next year will be thinking about doing a baseline bone density study.   Terrance Mass MD, 10:22 AM 06/10/2014

## 2014-06-10 NOTE — Patient Instructions (Signed)
Lupus (Lupus) El lupus (tambin denominado lupus eritematoso sistmico) es una enfermedad del sistema de defensas naturales del organismo (sistema inmunolgico). En esta enfermedad, el sistema inmunolgico ataca diferentes reas del cuerpo (enfermedad autoinmune). CAUSAS La causa es desconocida. Sin embargo, el lupus puede aparecer en miembros de una misma familia. Ciertos genes pueden favorecer la aparicin de esta enfermedad. Es un poco ms comn [name]. Tambin es ms frecuente Baxter International de raza afroamericana y los asiticos. Otros factores que tambin influyen son algunos virus (virus Epstein-Barr, EBV),estrs, hormonas, humo de cigarrillo y Print production planner. SNTOMAS Puede afectar diferentes partes del organismo, incluyendo las articulaciones, la piel, los riones, los pulmones, el corazn, el sistema nervioso y los vasos sanguneos. Los signos y sntomas difieren de Ardelia Mems persona a [place]. La enfermedad puede ser leve o poner peligro la vida. Los rasgos caractersticos de esta enfermedad son:  Erupcin en forma de mariposa en el rostro.  Artritis que involucra una o ms articulaciones.  Enfermedades renales.  [place], prdida de [place], prdida del cabello, fatiga.  Mala circulacin en los dedos de las manos y los pies (enfermedad de [place])  Dolor en el pecho al inspirar profundo. Tambin puede haber dolor abdominal.  Erupcin en la piel en zonas expuestas al sol.  Llagas en la boca y la [place]. DIAGNSTICO El diagnstico puede demorar mucho tiempo y con frecuencia es difcil. Es muy importante un registro exacto de los sntomas y de los problemas de [place]. Los anlisis de sangre son necesarios, aunque una nica prueba no puede confirmar la presencia de lupus. La State Farm de las personas que sufren lupus son positivos a los anticuerpos antinucleares en el anlisis de [place]. Anlisis adicionales de [place] y de [place] y en algunos casos la toma de [place] de tejido del  rin o de la piel pueden ayudar a Firefighter o descartar lupus. TRATAMIENTO No hay cura para el lupus. El Administrator, sports un plan de tratamiento basado en la edad, sexo, estado de [place], sntomas y [place]. Los objetivos son evitar brotes, tratarlos cuando aparezcan, minimizar el dao a los rganos y las complicaciones. El modo en que la enfermedad puede afectar a cada persona vara considerablemente. La State Farm de las personas que sufren lupus puede vivir una vida normal, pero esta enfermedad debe controlarse cuidadosamente. El tratamiento debe ajustarse segn las necesidades para evitar complicaciones. Medicamentos utilizados en el tratamiento:  Los antiinflamatorios no esteroides (AIDE) disminuyen la inflamacin y Printmaker en caso de dolor en el pecho, en las articulaciones y la [place]. Pueden ser ibuprofeno y naproxeno.  Los medicamentos contra la malaria fueron diseados para tratar la malaria. Tambin tratan la fatiga, Conservation officer, historic buildings en las articulaciones, las erupciones de la piel y la inflamacin de los pulmones en pacientes con lupus.  Los corticoides son hormonas poderosas que suprimen rpidamente la inflamacin. Se elegir la dosis ms baja que proporcione el mayor beneficio. Podrn recetarlos en forma de crema, pldoras, inyecciones o por vena (intravenosa).  Los medicamentos inmunosupresores detienen la formacin de clulas inmunes. Pueden utilizarse en casos de enfermedades del rin o nerviosas. INSTRUCCIONES PARA EL CUIDADO DOMICILIARIO  La prctica de ejercicios. Las actividades de bajo impacto ayudan a Theatre manager las articulaciones flexibles sin ser demasiado extenuantes.  Haga reposo luego de la actividad fsica.  Evite la excesiva exposicin al sol.  Siga una nutricin [place] y tome los suplementos que le haya indicado el mdico.  El control del estrs puede beneficiarlo. SOLICITE ATENCIN MDICA SI:  Elenore Rota  la fatiga.  Siente dolor.  Aparece una erupcin  cutnea.  La temperatura oral se eleva sin motivo por arriba de 38,9 C (102 F).  Siente molestias abdominales.  Comienza a Education officer, environmental de cabeza.  Tiene mareos. [place] of Neurological Disorders and Stroke ([place] y Accidentes Neurolgicos): MasterBoxes.it SPX Corporation of Rheumatology : www.rheumatology.Unisys Corporation of Arthritis and Musculoskeletal and Skin Diseases : Country[employer] Financial para la Artritis y las Enfermedades Musculoesquelticas y Dermatolgicas: [email or web address].SouthExposed.es Document Released: 06/08/2008 Document Revised: 06/04/2011 [facility] Patient Information 2015 [place]. This information is not intended to replace advice given to you by your health care provider. Make sure you discuss any questions you have with your health care provider. Artritis inespecfica (Arthritis, Nonspecific) La artritis es la inflamacin de una articulacin. Los sntomas son dolor, enrojecimiento, calor o hinchazn. Pueden verse involucradas una o ms articulaciones. Hay diferentes tipos de artritis. El mdico no podr Lobbyist cul es el tipo de artritis que usted sufre.  CAUSAS La causa ms frecuente es el desgaste de la articulacin (osteoartritis). Esto ocasiona lesiones en el cartlago, que puede romperse con [place]. Las zonas ms afectadas por este tipo de artritis son las rodillas, caderas, espalda y cuello. Otros tipos de artritis y causas frecuentes de dolor en la articulacin son:  Esguinces y otras lesiones cercanas a la articulacin}. En algunos casos, esguinces y lesiones menores causan dolor e hinchazn que aparece horas ms tarde.  Artritis reumatoidea Stryker Corporation, pies y rodillas. Generalmente afecta ambos lados del cuerpo al [employer]. Generalmente se asocia a enfermedades crnicas, fiebre, prdida de peso y debilidad general.  Artritis por cristales. La gota y la  pseudogota pueden causar dolor intenso agudo ocasional, enrojecimiento e hinchazn del pie, el tobillo o la rodilla.  Artritis infecciosa. Las bacterias pueden penetrar en la articulacin a travs de una herida en la piel. Esto puede causar una infeccin en la articulacin. Las bacterias y virus tambin pueden diseminarse a travs del torrente sanguneo y Print production planner las articulaciones.  Reacciones a medicamentos, infecciosas y Camera operator. En algunos casos las articulaciones duelen levemente y estn ligeramente hinchadas en este tipo de enfermedad. SNTOMAS  El dolor es el sntoma principal.  La articulacin tambin pueden verse roja, hinchada y caliente al tacto.  En ciertos tipos de artritis hay fiebre o malestar general.  En la articulacin que presenta artritis sentir dolor con el movimiento. En otros tipos de artritis hay rigidez. DIAGNSTICO: El mdico sospechar artritis basndose en la descripcin de los sntomas y en el examen. Ser necesario realizar pruebas para diagnosticar el tipo de artritis.  Anlisis de [place] y en algunos casos de [place].  Radiografas y en algunos casos tomografa computada o diagnstico por imgenes.  La remocin del lquido de Water engineer (artrocentesis) se realiza para Aeronautical engineer presencia de bacterias, cristales o por otras causas. Su mdico (o un especialista) adormecern la zona de la articulacin con un anestsico local y utilizarn una aguja para retirar lquido de la articulacin para ser examinado. Este procedimiento es slo mnimamente molesto.  An con estas pruebas, el mdico no podr decir qu tipo de artritis usted sufre. La consulta con un especialista (reumatlogo) puede ser de [employer]. TRATAMIENTO El mdico comentar con usted el tratamiento especfico para su tipo de artritis. Si el tipo especfico no puede determinarse, podrn aplicarse las siguientes recomendaciones generales.  El tratamiento para el dolor intenso de las  articulaciones consiste en:  Hacer reposo  Elevar el miembro.  Podrn prescribirle medicamentos antiinflamatorios (como ibuprofeno). Evite las actividades que aumenten Conservation officer, historic buildings.  Slo tome medicamentos de venta libre o prescriptos para Glass blower/designer y las [place], segn las indicaciones de su mdico.  Puede aplicarse compresas fras sobre la articulacin dolorida durante 10 a 15 minutos cada hora. Las compresas calientes tambin pueden ser beneficiosas, pero no las utilice durante la noche. No use compresas calientes sin autorizacin de su mdico, si es diabtico.  Una inyeccin de corticoides en la articulacin artrtica puede ayudar a reducir el dolor y la hinchazn.  Si una artritis aguda Kerr-McGee siguientes 1  2 [place], ser necesario descartar una infeccin. El tratamiento prolongado implica la modificacin de [place] y del estilo de vida para reducir Pension scheme manager. Puede ser necesario que baje de [place]. La actividad fsica es necesaria para nutrir Charity fundraiser de la articulacin y Tax adviser. Esto ayuda a Theatre manager fuertes los msculos que rodean Water engineer. INSTRUCCIONES PARA EL CUIDADO DOMICILIARIO  No tome aspirina para Best boy si se sospecha que sufre gota. Esto eleva los niveles de cido rico.  Solo tome medicamentos que se pueden comprar sin receta o recetados para Conservation officer, historic buildings, Tree surgeon o fiebre, como le indica el mdico.  [place] reposo todo el tiempo que pueda.  Si la articulacin est hinchada, mantngala elevada.  Utilice muletas si la articulacin que le duele est en la pierna.  Beber abundante cantidad de lquidos ser beneficioso para ciertos tipos de artritis.  [place] fsica regular puede ser beneficiosa, incluyendo las actividades de bajo impacto como:  [place].  [place].  Andar en bicicleta.  Caminar.  La rigidez matutina se Charleen Kirks con una ducha  caliente.  Tambin es beneficioso que realice ejercicios de amplitud de movimiento. SOLICITE ATENCIN MDICA SI:  No se siente mejor o empeora luego de las 24 horas.  Presenta efectos adversos por los medicamentos y no mejora con Dispensing optician. SOLICITE ATENCIN MDICA INMEDIATAMENTE SI:  Tiene fiebre.  Presenta fiebre o dolor intenso, hinchazn o enrojecimiento.  Muchas articulaciones estn involucradas y estn hinchadas y Tree surgeon.  Tiene un dolor intenso en la espalda o siente debilidad en las piernas.  Pierde el control de la vejiga o del intestino. Document Released: 03/12/2005 Document Revised: 06/04/2011 [facility] Patient Information 2015 [place]. This information is not intended to replace advice given to you by your health care provider. Make sure you discuss any questions you have with your health care provider. Levothyroxine tablets Qu es este medicamento? La LEVOTIROXINA es una hormona tiroidea. Este medicamento puede Unisys Corporation sntomas de deficiencia tiroidea, tales como hablar con lentitud, falta de energa, aumento de [place], cada del cabello, piel seca y sensibilidad inusual al fro. Tambin sirve para tratar una enfermedad llamada bocio (la dilatacin de la glndula tiroides). Se utiliza tambin para tratar algunos tipos de cncer tiroideo junto con la [place] y otros medicamentos. Este medicamento puede ser utilizado para otros usos; si tiene alguna pregunta consulte con su proveedor de atencin mdica o con su farmacutico. MARCAS COMERCIALES DISPONIBLES: Reino Bellis, Levo-T, Levothroid, Levoxyl, Synthroid, Thyro-Tabs, Unithroid [employer] debo informar a mi profesional de la salud antes de tomar este medicamento? Necesita saber si usted presenta alguno de los WESCO International o situaciones: -angina -problemas de coagulacin -diabetes -si est haciendo dieta o participa en un programa para bajar de peso -problemas de fertilidad -enfermedad  cardiaca -altos niveles de la hormona tiroidea -  problema de la glndula pituitaria -ataque cardaco previo -una reaccin alrgica o inusual a la levotiroxina, otras hormonas tiroideas, a otros medicamentos, alimentos, colorantes o conservantes -si est embarazada o buscando quedar embarazada -si est amamantando a un beb Cmo debo utilizar este medicamento? Celanese Corporation medicamento por va oral con agua abundante. Es mejor tomar este medicamento con el estmago vaco por lo menos 30 minutos antes o 2 horas despus de una comida. Siga las instrucciones de la etiqueta del [place]. Tmelo a la United Technologies Corporation. No tome su medicamento con una frecuencia mayor que la indicada. Hable con su pediatra para informarse acerca del uso de este medicamento en nios. Aunque este medicamento ha sido recetado a nios y bebs tan menores como unos pocos das de edad para condiciones selectivas, las precauciones se aplican. Para los bebs, puede triturar la tableta y mezclarla con una pequea cantidad (5-10 ml o 1 a 2 cucharaditas) de agua, leche materna o formula del lactante sin soya. No lo mezcle con formula del lactante a base de soya. Sobredosis: Pngase en contacto inmediatamente con un centro toxicolgico o una sala de urgencia si usted cree que haya tomado demasiado medicamento. ATENCIN: ConAgra Foods es solo para usted. No comparta este medicamento con nadie. Qu sucede si me olvido de una dosis? Si olvida una dosis, tmela lo antes posible. Si es casi la hora de la prxima dosis, tome slo esa dosis. No tome dosis adicionales o dobles. Qu puede interactuar con este medicamento? -amiodarona -anticidos -medicamentos antitiroideos -suplementos de calcio -carbamazepina -colestiramina -colestipol -digoxina -hormonas femeninas, incluyendo el contraconceptivo o las pldoras anticonceptivas -suplementos de hierro -Personal assistant -productos lquidos de nutricin, como Ensure -medicamentos  para resfros y Primary school teacher -medicamentos para la diabetes -medicamentos para la depresin mental -medicamentos o productos a base de hierbas para bajar de peso o reducir el apetito -fenobarbital u otros barbitricos -fenitona -prednisona u otros corticosteroides -rifabutina -rifampicina -isoflavonas de soya -sucralfato -teofilina -warfarina Puede ser que esta lista no menciona todas las posibles interacciones. Informe a su profesional de KB Home	[place] de AES Corporation productos a base de hierbas, medicamentos de [place] o suplementos nutritivos que est tomando. Si usted fuma, consume bebidas alcohlicas o si utiliza drogas ilegales, indqueselo tambin a su profesional de KB Home	[place]. Algunas sustancias pueden interactuar con su medicamento. A qu debo estar atento al usar Coca-Cola? Asegrese de tomar Coca-Cola con lquidos en abundancia. Ciertas marcas de las tabletas pueden provocar asfixia, arcadas o dificultad al tragar a caso de que la tableta se le puede quedar atascada en la garganta. La mayora de estos problemas se desaparecen cuando tome el medicamento con la cantidad [place] de agua u otros lquidos. No cambie de marca de Coca-Cola a menos que su profesional de la salud est de acuerdo. Consulte con su profesional de la salud si no est seguro. Necesitar realizarse C.H. Robinson Worldwide regularmente y Tolu de sangre de [place] en cuando para evaluar su respuesta al [place]. Si est News Corporation para el hipotiroidismo, pueden transcurrir varias semanas antes de que observe Southwest Airlines. Consulte con su mdico o con su profesional de la salud si sus sntomas no mejoran. Tal vez deba usar este medicamento de por vida. No deje de usarlo a menos que su mdico o su profesional de KB Home	[place] as lo indique. Este medicamento puede afectar su nivel de Dispensing optician. Si tiene diabetes, controle su nivel de azcar como se le haya indicado. Es posible que se le  caiga un poco el cabello al comenzar Berkshire Hathaway. Por lo general este problema se resuelve solo. Si va a someterse a una operacin, informe a su mdico o a su profesional de la salud que est tomando Coca-Cola. Qu efectos secundarios puedo tener al Masco Corporation este medicamento? Efectos secundarios que debe informar a su mdico o a Barrister's clerk de la salud tan pronto como sea posible: -Chief of Staff como erupcin cutnea, picazn o urticarias, hinchazn de la cara, labios o lengua -dolor en el pecho -sudoracin excesiva o intolerancia al calor -pulso cardiaco rpido o irregular -nerviosismo -erupcin cutnea o urticaria -hinchazn de tobillos, pies o piernas -temblores Efectos secundarios que, por lo general, no requieren atencin mdica (debe informarlos a su mdico o a su profesional de la salud si persisten o si son molestos): -cambios en el apetito -cambios en los perodos menstruales -diarrea -cada del cabello -dolor de cabeza -problemas para dormir -prdida de peso Puede ser que esta lista no menciona todos los posibles efectos secundarios. Comunquese a su mdico por asesoramiento mdico Humana Inc. Usted puede informar los efectos secundarios a la FDA por telfono al 1-800-FDA-1088. Dnde debo guardar mi medicina? Mantngala fuera del alcance de los nios. [place] a FPL Group, entre 15 y 41 grados C (32 y 36 grados F). Protjala de la luz y de la humedad. Mantenga el envase bien cerrado. Deseche todo el medicamento que no haya utilizado, despus de la fecha de vencimiento. ATENCIN: Este folleto es un resumen. Puede ser que no cubra toda la posible informacin. Si usted tiene preguntas acerca de esta medicina, consulte con su mdico, su farmacutico o su profesional de Technical sales engineer.  2015, Elsevier/Gold Standard. (2008-08-13 17:07:22) Hipotiroidismo (Hypothyroidism) La tiroides es una glndula grande ubicada en la parte anterior e  inferior del cuello. La glndula tiroides interviene en el control del metabolismo. El metabolismo es el modo en que el organismo utiliza los alimentos. El control del metabolismo se realiza a travs de una hormona denominada tiroxina. Cuando la actividad de esta glndula est por debajo de lo normal (hipotiroidismo) produce muy poca cantidad de hormona. CAUSAS Aqu se incluyen:   Ausencia de tejido tiroideo.  Bocio por dficit de yodo.  Bocio por medicamentos.  Defectos congnitos (desde el nacimiento).  Trastornos de la glndula pituitaria Esto ocasiona la falta de TSH (sigla que significa hormona estimulante de la tiroides) Esta hormona le informa a la tiroides que debe producir ms hormona. SNTOMAS  Letargia (sentir que no se tiene Teacher, early years/pre)  Intolerancia al fro  Sunoco (a pesar de una ingesta normal de alimentos)  Piel seca  Cabello seco  Irregularidades menstruales  Enlentecimiento de los procesos de pensamiento La insuficiente cantidad de hormona tiroidea tambin puede ocasionar problemas cardacos. El hipotiroidismo en el recin nacido es el cretinismo en su forma extrema. Es importante que esta forma se trate de modo adecuado e inmediato, ya que puede conducir rpidamente al retardo del desarrollo fsico y mental. DIAGNSTICO Para comprobar la existencia de hipotiroidismo, Mining engineer anlisis de sangre y radiografas y estudios con [place]. Muchas veces los signos estn ocultos. Es necesario que el profesional vigile la enfermedad con anlisis de [place]. Esto se realiza luego de Electrical engineer diagnstico (determinar cul es el problema). Puede ser necesario que el profesional que lo asiste controle esta enfermedad con anlisis de sangre ya sea antes o despus del diagnstico y [place]. TRATAMIENTO Los niveles bajos de hormona tiroidea se incrementan con el uso de  hormona tiroidea sinttica. Este es un tratamiento seguro y [place]. Se  dispone de hormona tiroidea sinttica para el tratamiento de este trastorno. Generalmente lleva algunas semanas obtener el efecto total de los medicamentos. Luego de obtener el efecto completo del [place], habitualmente pasan otras cuatro semanas para que los sntomas empiezan a Armed forces operational officer. El profesional podr comenzar indicndole dosis bajas. Si usted tuvo problemas cardacos, la dosis se aumentar de manera gradual. Podr volver a lo normal sin Firefighter una situacin de emergencia. [place] los Pulte Homes ha indicado el profesional que lo asiste. Infrmele al profesional todos los medicamentos que toma o que ha comenzado a Radio producer. El profesional que lo asiste lo ayudar con los esquemas de las dosis.  A medida que obtiene mejora, es necesario aumentar la dosis. Ser necesario Optometrist continuos anlisis de [place], segn lo indique el profesional.  Informe acerca de todos los efectos secundarios que sospeche que podran deberse a los medicamentos. SOLICITE ATENCIN MDICA SI: Solicite atencin mdica si observa:  Sudoracin.  Temblores.  Ansiedad.  Rpida prdida de peso.  Intolerancia al calor.  Cambios emocionales.  Diarrea.  Debilidad. SOLICITE ATENCIN MDICA DE INMEDIATO SI: Presenta dolor en el pecho, una frecuencia cardaca irregular (palpitaciones) o latidos cardacos rpidos. EST SEGURO QUE:   Comprende las instrucciones para el alta mdica.  Controlar su enfermedad.  Solicitar atencin mdica de inmediato segn las indicaciones. Document Released: 03/12/2005 Document Revised: 06/04/2011 [facility] Patient Information 2015 [place]. This information is not intended to replace advice given to you by your health care provider. Make sure you discuss any questions you have with your health care provider.

## 2014-06-11 ENCOUNTER — Other Ambulatory Visit: Payer: Self-pay | Admitting: Gynecology

## 2014-06-11 ENCOUNTER — Telehealth: Payer: Self-pay

## 2014-06-11 ENCOUNTER — Other Ambulatory Visit: Payer: Self-pay

## 2014-06-11 LAB — URINALYSIS W MICROSCOPIC + REFLEX CULTURE
Bilirubin Urine: NEGATIVE
Casts: NONE SEEN
Crystals: NONE SEEN
Glucose, UA: NEGATIVE mg/dL
HGB URINE DIPSTICK: NEGATIVE
Ketones, ur: NEGATIVE mg/dL
LEUKOCYTES UA: NEGATIVE
NITRITE: NEGATIVE
PH: 5.5 (ref 5.0–8.0)
PROTEIN: NEGATIVE mg/dL
Specific Gravity, Urine: 1.021 (ref 1.005–1.030)
Urobilinogen, UA: 0.2 mg/dL (ref 0.0–1.0)

## 2014-06-11 LAB — CYTOLOGY - PAP

## 2014-06-11 LAB — ANA: Anti Nuclear Antibody(ANA): NEGATIVE

## 2014-06-11 MED ORDER — LEVOTHYROXINE SODIUM 50 MCG PO TABS: 50.0000 ug | ORAL_TABLET | Freq: Every day | ORAL | Status: DC

## 2014-06-11 NOTE — Telephone Encounter (Signed)
Call in prescription refill for Synthroid 50 g 1 by mouth daily #30 with 11 refills

## 2014-06-11 NOTE — Telephone Encounter (Signed)
-----   Message from Terrance Mass, MD sent at 06/11/2014 11:02 AM EDT ----- Please inform patient all her lab tests were normal. Pap smear still pending

## 2014-06-11 NOTE — Telephone Encounter (Signed)
Patient's TSH was normal. She needs refills on her generic Synthroid if ok?

## 2014-06-11 NOTE — Telephone Encounter (Signed)
Rx sent 

## 2014-06-14 ENCOUNTER — Other Ambulatory Visit: Payer: Self-pay | Admitting: Gynecology

## 2014-06-14 LAB — URINE CULTURE

## 2014-06-14 MED ORDER — NITROFURANTOIN MONOHYD MACRO 100 MG PO CAPS: 100.0000 mg | ORAL_CAPSULE | Freq: Two times a day (BID) | ORAL | Status: DC

## 2014-06-24 ENCOUNTER — Encounter: Payer: Self-pay | Admitting: Gynecology

## 2015-04-20 ENCOUNTER — Other Ambulatory Visit: Payer: Self-pay | Admitting: Gynecology

## 2015-04-21 NOTE — Telephone Encounter (Signed)
Needs annual exam by March

## 2015-04-21 NOTE — Telephone Encounter (Signed)
Called into pharmacy

## 2015-08-05 ENCOUNTER — Encounter: Payer: Self-pay | Admitting: Anesthesiology

## 2015-08-08 ENCOUNTER — Ambulatory Visit (INDEPENDENT_AMBULATORY_CARE_PROVIDER_SITE_OTHER): Payer: BC Managed Care – PPO | Admitting: Gynecology

## 2015-08-08 ENCOUNTER — Encounter: Payer: Self-pay | Admitting: Gynecology

## 2015-08-08 VITALS — BP 128/76 | Ht 61.0 in | Wt 171.0 lb

## 2015-08-08 DIAGNOSIS — F419 Anxiety disorder, unspecified: Secondary | ICD-10-CM | POA: Diagnosis not present

## 2015-08-08 DIAGNOSIS — Z01419 Encounter for gynecological examination (general) (routine) without abnormal findings: Secondary | ICD-10-CM

## 2015-08-08 DIAGNOSIS — E038 Other specified hypothyroidism: Secondary | ICD-10-CM

## 2015-08-08 DIAGNOSIS — M6289 Other specified disorders of muscle: Secondary | ICD-10-CM

## 2015-08-08 DIAGNOSIS — N951 Menopausal and female climacteric states: Secondary | ICD-10-CM | POA: Diagnosis not present

## 2015-08-08 MED ORDER — LEVOTHYROXINE SODIUM 50 MCG PO TABS: 50.0000 ug | ORAL_TABLET | Freq: Every day | ORAL | Status: DC

## 2015-08-08 MED ORDER — ALPRAZOLAM 0.25 MG PO TABS: 0.2500 mg | ORAL_TABLET | Freq: Every evening | ORAL | Status: DC | PRN

## 2015-08-08 NOTE — Progress Notes (Signed)
Le[facility] Mar 18, 1961 [id number]   History:    55 y.o.  for annual gyn exam with occasional urinary incontinence. Patient with past history of hypothyroidism currently on levothyroxine 50 g daily. Patient has a Nexplanon which is due to be removed next year. Patient suffers from no vasomotor symptoms. Patient had a normal colonoscopy in 2016. Patient had a mammogram last week and was instructed to return back for additional views with ultrasound this week. Patient reports no vaginal bleeding. Patient is moving to [place] for 1 year.  Past medical history,surgical history, family history and social history were all reviewed and documented in the EPIC chart.  Gynecologic History Patient's last menstrual period was 06/15/2015. Contraception: Nexplanon Last Pap: 2013 2016. Results were: normal Last mammogram: See above. Results were: See above  Obstetric History OB History  Gravida Para Term Preterm AB SAB TAB Ectopic Multiple Living  2 2 2       2     # Outcome Date GA Lbr Len/2nd Weight Sex Delivery Anes PTL Lv  2 Term     F Vag-Spont  N Y  1 Term     F Vag-Spont  N Y       ROS: A ROS was performed and pertinent positives and negatives are included in the history.  GENERAL: No fevers or chills. HEENT: No change in vision, no earache, sore throat or sinus congestion. NECK: No pain or stiffness. CARDIOVASCULAR: No chest pain or pressure. No palpitations. PULMONARY: No shortness of breath, cough or wheeze. GASTROINTESTINAL: No abdominal pain, nausea, vomiting or diarrhea, melena or bright red blood per rectum. GENITOURINARY: No urinary frequency, urgency, hesitancy or dysuria. MUSCULOSKELETAL: No joint or muscle pain, no back pain, no recent trauma. DERMATOLOGIC: No rash, no itching, no lesions. ENDOCRINE: No polyuria, polydipsia, no heat or cold intolerance. No recent change in weight. HEMATOLOGICAL: No anemia or easy bruising or bleeding. NEUROLOGIC: No headache, seizures,  numbness, tingling or weakness. PSYCHIATRIC: No depression, no loss of interest in normal activity or change in sleep pattern.     Exam: chaperone present  BP 128/76 mmHg  Ht 5\' 1"  (1.549 m)  Wt 171 lb (77.565 kg)  BMI 32.33 kg/m2  LMP 06/15/2015  Body mass index is 32.33 kg/(m^2).  General appearance : Well developed well nourished female. No acute distress HEENT: Eyes: no retinal hemorrhage or exudates,  Neck supple, trachea midline, no carotid bruits, no thyroidmegaly Lungs: Clear to auscultation, no rhonchi or wheezes, or rib retractions  Heart: Regular rate and rhythm, no murmurs or gallops Breast:Examined in sitting and supine position were symmetrical in appearance, no palpable masses or tenderness,  no skin retraction, no nipple inversion, no nipple discharge, no skin discoloration, no axillary or supraclavicular lymphadenopathy Abdomen: no palpable masses or tenderness, no rebound or guarding Extremities: no edema or skin discoloration or tenderness  Pelvic:  Bartholin, Urethra, Skene Glands: Within normal limits             Vagina: No gross lesions or discharge  Cervix: No gross lesions or discharge  Uterus  anteverted, normal size, shape and consistency, non-tender and mobile  Adnexa  Without masses or tenderness  Anus and perineum  normal   Rectovaginal  normal sphincter tone without palpated masses or tenderness             Hemoccult will provide     Assessment/Plan:  55 y.o. female for annual exam will return back to the office sometime next week for  fasting blood work. She will also bring the fecal Hemoccult cards at the same time. She was reminded to follow-up with the recommended breast ultrasound this week. We also discussed importance of monthly self breast examination. Prescription refill for her levothyroxine 50 g was provided. The following labs will be ordered for next week: Fasting lipid profile, comprehensive metabolic panel, TSH, CBC, and urinalysis. A  vitamin D level will be ordered today as well because of her complaints of muscle tightness at 52. Literature information on stress urinary incontinence and on Kegel exercises were provided in Spanish Pap smear not indicated this year.   Terrance Mass MD, 3:59 PM 08/08/2015

## 2015-08-08 NOTE — Patient Instructions (Signed)
Ejercicios de Kegel  Barrister's clerk) El objetivo de los ejercicios de Kegel es aislar y Chief Technology Officer los msculos del suelo plvico. Estos msculos actan como una hamaca que soporta el recto, la vagina, el intestino delgado y [place]. A medida que los msculos se debilitan, se hunden y esos rganos son desplazados de sus posiciones normales. Los ejercicios de Kegel fortalecen los msculos del suelo plvico y ayudan a Garment/textile technologist control de la vejiga y del intestino, mejoran la respuesta sexual y Therapist, music a Proofreader problemas y Dispensing optician durante el [place]. Se pueden hacer en cualquier lugar y en cualquier momento.  [place] LOS EJERCICIOS DE KEGEL  1. Localice los msculos del suelo plvico. Para ello apriete (contraiga) los msculos que se utilizan para tratar de Scientist, water quality el flujo de [place]. Usted se sentir una opresin en el rea vaginal (mujeres) y que se eleva y comprime la zona rectal (hombres y mujeres). 2. Para comenzar, contraiga los msculos plvicos durante 2 a 5 segundos y despus reljelos durante 2 a 5 segundos. Esta es una serie. Repita 4 a 5 series con una breve pausa en el medio. 3. Contraiga los msculos de la pelvis durante 8 a 10 segundos y despus reljelos durante 8 a 10 segundos. Repita 4 a 5 series. Si no puede contraer los msculos de la pelvis durante 8 a 10 segundos, trate de 5 a 7 segundos y Higher education careers adviser 8 a 10 segundos. Su objetivo es Optometrist 4 a 5 series de 10 Passenger transport manager. Mantenga el estmago, las nalgas y las piernas relajadas [place]. Realice ambas series de contracciones, cortas y largas. [place] posiciones. [place] contracciones 3 a 4 veces por Training and development officer. [place] series mientras est:    Acostado en la cama por la [place].  De pie en el almuerzo.  Sentado por las tardes.  Acostado en la cama por la noche.  Debe hacer 40 a 50 contracciones por da. No realice ms ejercicios de Kegel por da que lo recomendado. El  exceso de ejercicio puede causar fatiga muscular. Contine con estos ejercicios durante al menos 15 a 20 semanas o segn las indicaciones de su mdico.    Esta informacin no tiene Marine scientist el consejo del mdico. Asegrese de hacerle al mdico cualquier pregunta que tenga.   Document Released: 02/27/2012 Elsevier Interactive Patient Education 2016 [place] (Urinary Incontinence) La incontinencia urinaria es la prdida involuntaria de orina de la vejiga. CAUSAS  Hay muchas causas de incontinencia urinaria. Ellas son: 4. Medicamentos. 5. Infecciones. 6. Agrandamiento prosttico que causa un aumento del flujo de [place] de la vejiga. 7. Ciruga. 8. Enfermedades neurolgicas. 9. Factores emocionales. SIGNOS Y SNTOMAS Hay cuatro tipos de incontinencia urinaria:  Incontinencia urinaria de urgencia: es la prdida involuntaria de orina antes de llegar al bao. Hay una urgencia repentina de orinar pero no llega a tiempo al bao.  Incontinencia por estrs: es la prdida repentina de orina con cualquier actividad que fuerza a pasar orina. La causa ms frecuente son los cambios anatmicos en la pelvis y las zonas del esfnter.  Incontinencia por rebosamiento: es la prdida de orina por una obstruccin de la apertura de la vejiga. Esto causa un retroceso de la orina y una acumulacin de presin dentro de la vejiga. Cuando la presin dentro de la vejiga excede la presin que Engineer, manufacturing systems, la orina rebalsa y causa incontinencia, similar al desbordamiento de un dique.  Incontinencia total: es la prdida  de orina como resultado de la incapacidad de Financial controller orina dentro de la vejiga. DIAGNSTICO  La evaluacin de la causa puede requerir:  Ardelia Mems historia mdica y obsttrica exhaustiva y [place].  Examen fsico completo.  Anlisis de laboratorio como un urocultivo y [place]. Cuando se indican estudios adicionales, pueden incluirse:  Una  ecografa.  Radiografas de vejiga y riones.  Una cistoscopa. Consiste en un examen de la vejiga utilizando un telescopio pequeo.  Estudios urodinmicos para comprobar la funcin nerviosa de la vejiga y Social worker. TRATAMIENTO  El tratamiento de la incontinencia urinaria depende de la causa:  Para la incontinencia urinaria causada por una infeccin urinaria, le indicarn antibiticos. Si la incontinencia urinaria se relaciona con los [employer] toma, el mdico podr Public relations account executive.  Para la incontinencia por estrs, en necesaria una ciruga para restablecer el soporte anatmico de la vejiga o el esfnter, o ambos, lo que Clinical research associate.  Para la incontinencia por rebosamiento causada por una prstata agrandada, una operacin para abrir el canal a travs de la prstata agrandada permitir que el flujo de orina salga de la vejiga. En las mujeres con fibromas, ser necesaria una histerectoma.  Para la incontinencia total, una ciruga del esfnter podr ayudar. Puede ser necesario colocar un esfnter urinario artificial (se coloca un manguito inflable alrededor de Geologist, engineering). En las mujeres que hayan desarrollado un pasaje similar a un orificio entre la vejiga y la vagina (fstula vesicovaginal ), ser necesario realizar una ciruga para cerrar la fstula. INSTRUCCIONES PARA EL CUIDADO EN EL HOGAR  La higiene diaria normal y el uso regular de apsitos o paales para adultos cambiados con regularidad ayudan a prevenir olores y daos en la piel.  Evite la cafena. Puede sobreestimular la vejiga.  Use el bao con regularidad. Trate de ir al bao cada 2  3 horas, aunque no sienta la necesidad. Tmese el tiempo para vaciar la vejiga completamente. Despus de orinar, espere un minuto. Luego trate de orinar nuevamente.  Para las causas que implican una disfuncin nerviosa, lleve un registro de los medicamentos que toma y un diario de las veces que va al bao. SOLICITE  ATENCIN MDICA SI:  Despus del procedimiento, experimenta un empeoramiento del dolor en vez de mejorar.  La incontinencia empeora en vez de mejorar. SOLICITE ATENCIN MDICA DE INMEDIATO SI:  Le sube la fiebre o tiene escalofros.  No puede orinar.  Observa irritacin en la ingle o baja hacia los muslos. ASEGRESE DE QUE:   Comprende estas instrucciones.   Controlar su afeccin.  Recibir ayuda de inmediato si no mejora o si empeora.   Esta informacin no tiene Marine scientist el consejo del mdico. Asegrese de hacerle al mdico cualquier pregunta que tenga.   Document Released: 03/12/2005 Document Revised: 04/02/2014 Elsevier Interactive Patient Education [employer].

## 2015-08-09 LAB — URINALYSIS W MICROSCOPIC + REFLEX CULTURE
BILIRUBIN URINE: NEGATIVE
Bacteria, UA: NONE SEEN [HPF]
CASTS: NONE SEEN [LPF]
Crystals: NONE SEEN [HPF]
GLUCOSE, UA: NEGATIVE
HGB URINE DIPSTICK: NEGATIVE
KETONES UR: NEGATIVE
Leukocytes, UA: NEGATIVE
NITRITE: NEGATIVE
PH: 7.5 (ref 5.0–8.0)
Protein, ur: NEGATIVE
RBC / HPF: NONE SEEN RBC/HPF (ref <=2)
SPECIFIC GRAVITY, URINE: 1.017 (ref 1.001–1.035)
SQUAMOUS EPITHELIAL / LPF: NONE SEEN [HPF] (ref <=5)
WBC UA: NONE SEEN WBC/HPF (ref <=5)
Yeast: NONE SEEN [HPF]

## 2015-08-16 ENCOUNTER — Other Ambulatory Visit: Payer: BC Managed Care – PPO

## 2015-08-16 LAB — CBC WITH DIFFERENTIAL/PLATELET
BASOS PCT: 0 %
Basophils Absolute: 0 cells/uL (ref 0–200)
EOS ABS: 156 {cells}/uL (ref 15–500)
Eosinophils Relative: 3 %
HEMATOCRIT: 40.3 % (ref 35.0–45.0)
HEMOGLOBIN: 13.8 g/dL (ref 11.7–15.5)
Lymphocytes Relative: 28 %
Lymphs Abs: 1456 cells/uL (ref 850–3900)
MCH: 32.2 pg (ref 27.0–33.0)
MCHC: 34.2 g/dL (ref 32.0–36.0)
MCV: 94.2 fL (ref 80.0–100.0)
MONO ABS: 364 {cells}/uL (ref 200–950)
MPV: 9.5 fL (ref 7.5–12.5)
Monocytes Relative: 7 %
NEUTROS ABS: 3224 {cells}/uL (ref 1500–7800)
Neutrophils Relative %: 62 %
Platelets: 210 10*3/uL (ref 140–400)
RBC: 4.28 MIL/uL (ref 3.80–5.10)
RDW: 13.1 % (ref 11.0–15.0)
WBC: 5.2 10*3/uL (ref 3.8–10.8)

## 2015-08-16 LAB — LIPID PANEL
CHOLESTEROL: 141 mg/dL (ref 125–200)
HDL: 39 mg/dL — ABNORMAL LOW (ref >=46)
LDL CALC: 87 mg/dL (ref <130)
TRIGLYCERIDES: 74 mg/dL (ref <150)
Total CHOL/HDL Ratio: 3.6 Ratio (ref <=5.0)
VLDL: 15 mg/dL (ref <30)

## 2015-08-16 LAB — COMPREHENSIVE METABOLIC PANEL
ALK PHOS: 83 U/L (ref 33–130)
ALT: 15 U/L (ref 6–29)
AST: 18 U/L (ref 10–35)
Albumin: 4 g/dL (ref 3.6–5.1)
BUN: 13 mg/dL (ref 7–25)
CALCIUM: 8.9 mg/dL (ref 8.6–10.4)
CO2: 23 mmol/L (ref 20–31)
Chloride: 106 mmol/L (ref 98–110)
Creat: 0.68 mg/dL (ref 0.50–1.05)
Glucose, Bld: 99 mg/dL (ref 65–99)
POTASSIUM: 3.9 mmol/L (ref 3.5–5.3)
Sodium: 138 mmol/L (ref 135–146)
TOTAL PROTEIN: 6.4 g/dL (ref 6.1–8.1)
Total Bilirubin: 0.7 mg/dL (ref 0.2–1.2)

## 2015-08-17 ENCOUNTER — Encounter: Payer: Self-pay | Admitting: Gynecology

## 2015-08-17 ENCOUNTER — Other Ambulatory Visit: Payer: Self-pay | Admitting: Radiology

## 2015-08-17 ENCOUNTER — Other Ambulatory Visit: Payer: Self-pay | Admitting: Gynecology

## 2015-08-17 DIAGNOSIS — R7989 Other specified abnormal findings of blood chemistry: Secondary | ICD-10-CM

## 2015-08-17 DIAGNOSIS — E559 Vitamin D deficiency, unspecified: Secondary | ICD-10-CM

## 2015-08-17 LAB — VITAMIN D 25 HYDROXY (VIT D DEFICIENCY, FRACTURES): VIT D 25 HYDROXY: 29 ng/mL — AB (ref 30–100)

## 2015-08-17 LAB — TSH: TSH: 5.99 mIU/L — ABNORMAL HIGH

## 2015-08-17 MED ORDER — LEVOTHYROXINE SODIUM 75 MCG PO TABS: 75.0000 ug | ORAL_TABLET | Freq: Every day | ORAL | Status: DC

## 2015-08-17 MED ORDER — VITAMIN D (ERGOCALCIFEROL) 1.25 MG (50000 UNIT) PO CAPS: 50000.0000 [IU] | ORAL_CAPSULE | ORAL | Status: DC

## 2015-08-19 ENCOUNTER — Encounter: Payer: Self-pay | Admitting: Gynecology

## 2015-08-30 ENCOUNTER — Other Ambulatory Visit: Payer: Self-pay | Admitting: Anesthesiology

## 2015-08-30 DIAGNOSIS — Z1211 Encounter for screening for malignant neoplasm of colon: Secondary | ICD-10-CM

## 2016-08-08 ENCOUNTER — Encounter: Payer: Self-pay | Admitting: Gynecology

## 2016-09-10 IMAGING — CT CT ABD-PELV W/ CM
1 of 3 series · 14 of 32 positions shown, 19 images · IV contrast (OMNIPAQUE 300)
Comparison: Pelvic ultrasound performed 12/23/2009

CLINICAL DATA: Subacute onset of lower pelvic pain, radiating to
the right lower quadrant. Initial encounter.

EXAM:
CT ABDOMEN AND PELVIS WITH CONTRAST
TECHNIQUE: Multidetector CT imaging of the abdomen and pelvis was performed
using the standard protocol following bolus administration of
intravenous contrast.
CONTRAST:  50mL OMNIPAQUE IOHEXOL 300 MG/ML SOLN, 100mL OMNIPAQUE
IOHEXOL 300 MG/ML SOLN

[Series 2: abd/pel with · axial · 0.77mm/px · z∈[+1052,+1452]mm · 14 of 92 slices shown, 19 images]
[im 6/92  soft-tissue]
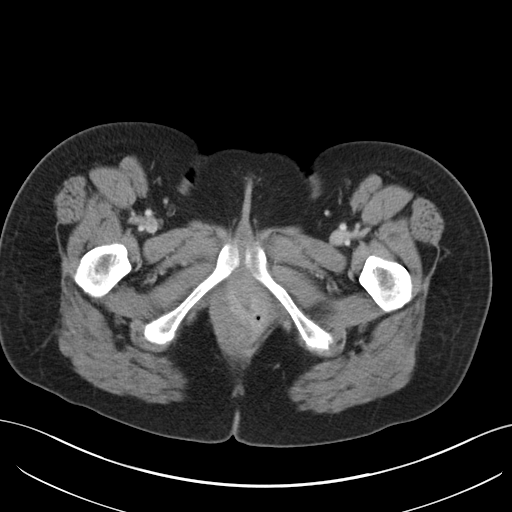
[im 6/92  bone]
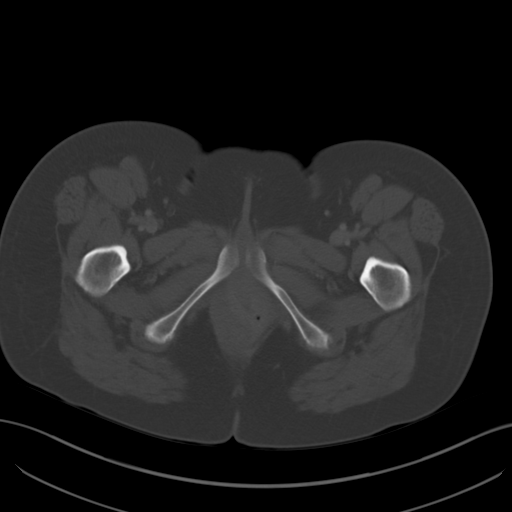
[im 11/92  soft-tissue]
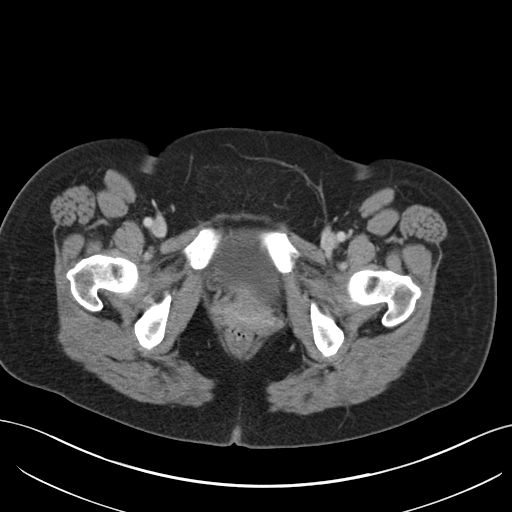
[im 21/92  soft-tissue]
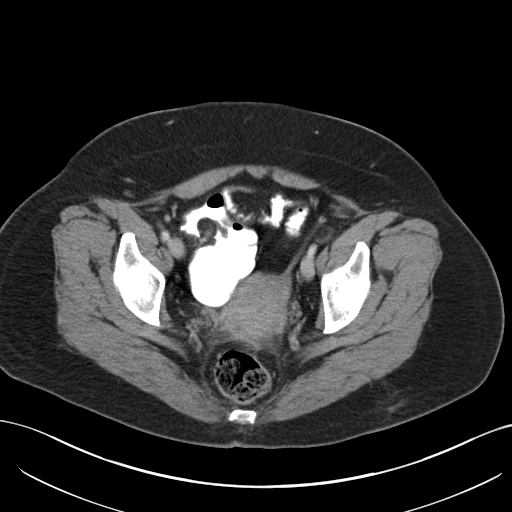
[im 26/92  soft-tissue]
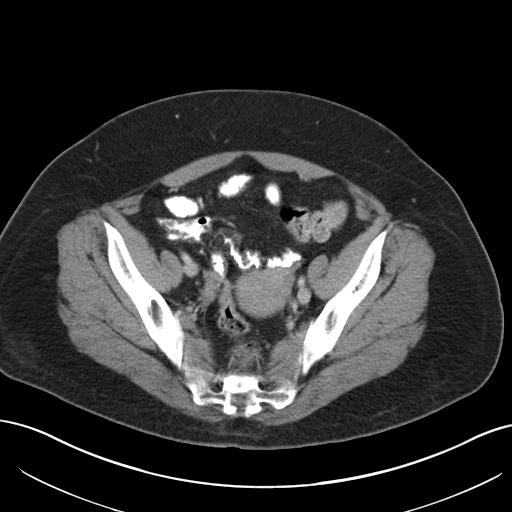
[im 31/92  soft-tissue]
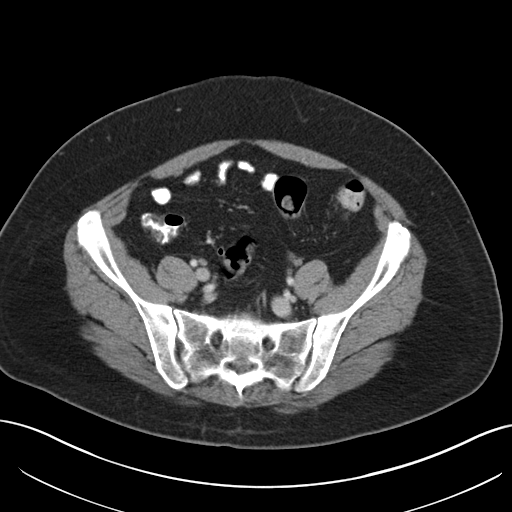
[im 41/92  soft-tissue]
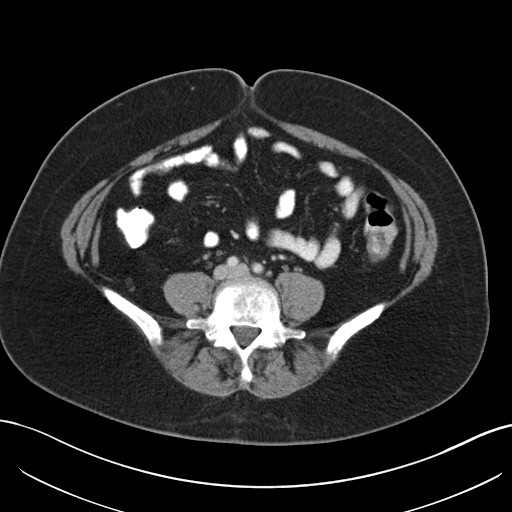
[im 46/92  soft-tissue]
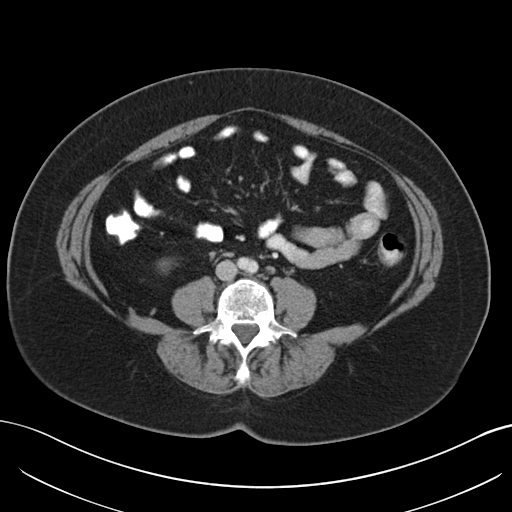
[im 51/92  soft-tissue]
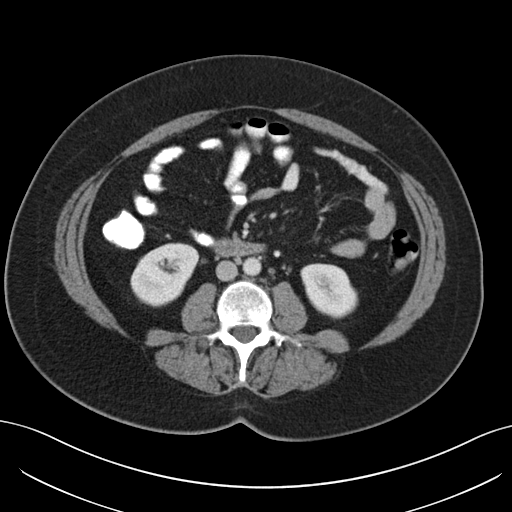
[im 61/92  soft-tissue]
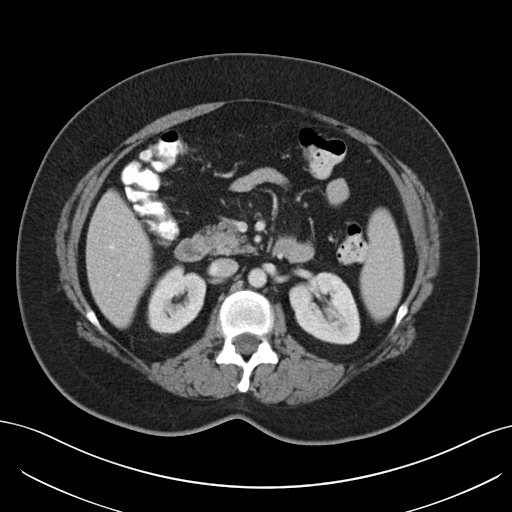
[im 61/92  bone]
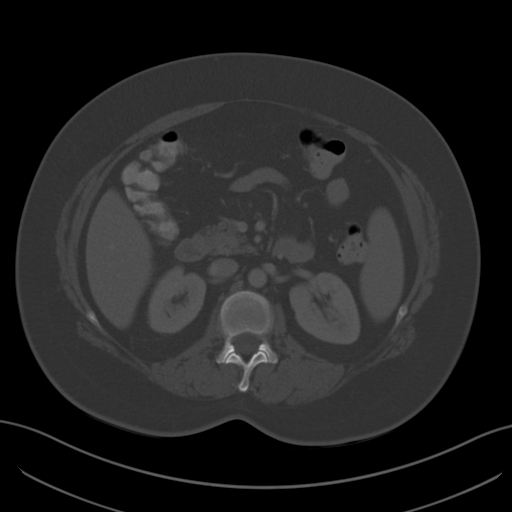
[im 66/92  soft-tissue]
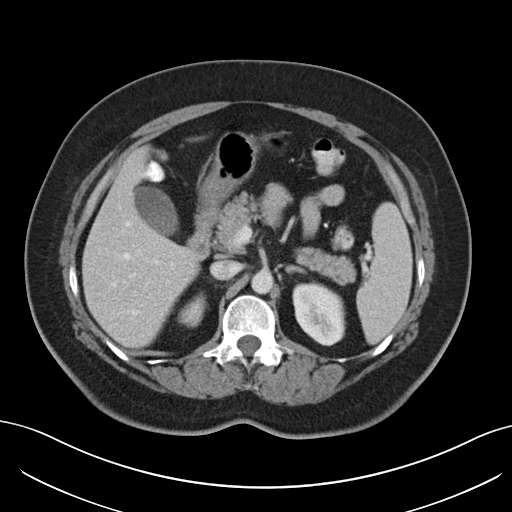
[im 71/92  soft-tissue]
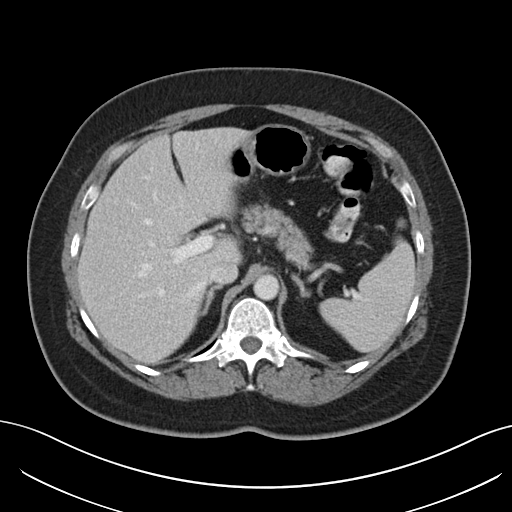
[im 71/92  lung]
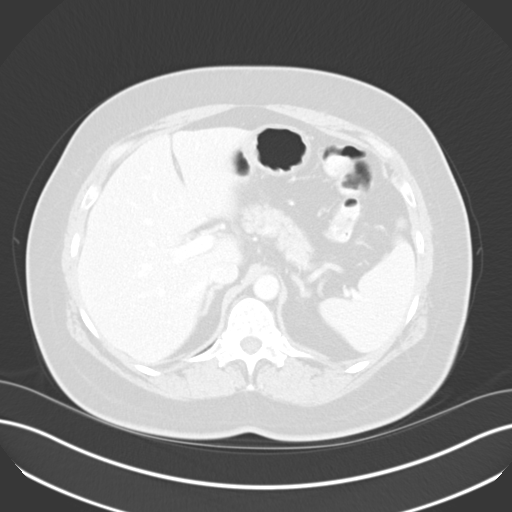
[im 76/92  lung]
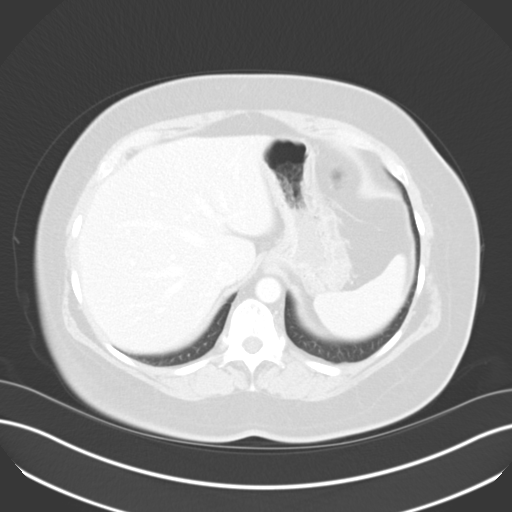
[im 81/92  soft-tissue]
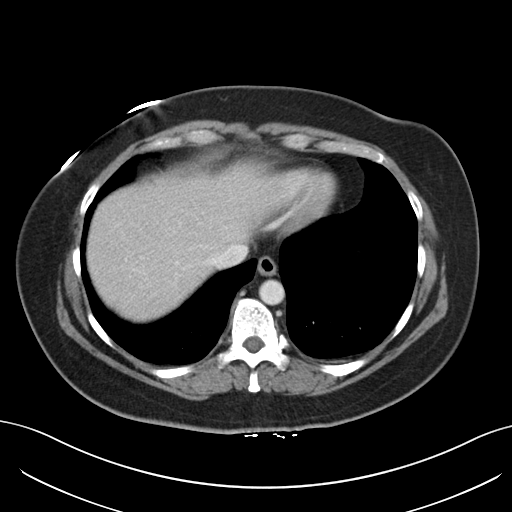
[im 81/92  lung]
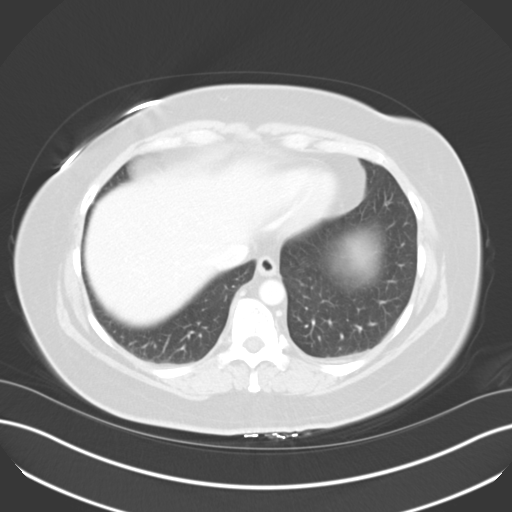
[im 86/92  soft-tissue]
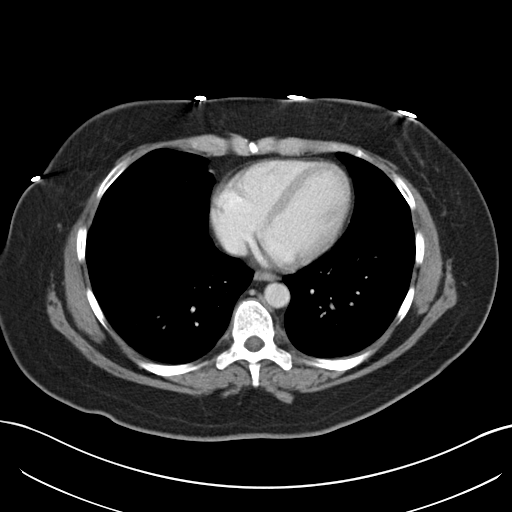
[im 86/92  lung]
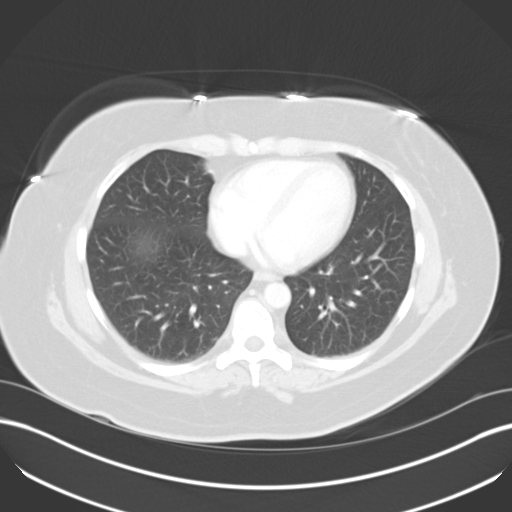

[14 of 32 positions shown; findings below may reference images not displayed]

FINDINGS: The visualized lung bases are clear.

The liver and spleen are unremarkable in appearance. The gallbladder
is within normal limits. The pancreas and adrenal glands are
unremarkable.

The kidneys are unremarkable in appearance. There is no evidence of
hydronephrosis. No renal or ureteral stones are seen. No perinephric
stranding is appreciated.

No free fluid is identified. The small bowel is unremarkable in
appearance. The stomach is within normal limits. No acute vascular
abnormalities are seen.

The appendix is normal in caliber and filled with air, without
evidence for appendicitis. Contrast progresses to the level of the
descending colon. The colon is unremarkable in appearance.

The bladder is mildly distended and grossly unremarkable. An
apparent small 1.1 cm fibroid is seen within the uterus. The uterus
is otherwise unremarkable. The ovaries are relatively symmetric,
with a likely left-sided follicle seen. No inguinal lymphadenopathy
is seen.

No acute osseous abnormalities are identified.
IMPRESSION: 1. No acute abnormality seen to explain the patient's symptoms.
2. Small fibroid noted within the uterus. Uterus otherwise grossly
unremarkable.

## 2017-01-22 ENCOUNTER — Ambulatory Visit (INDEPENDENT_AMBULATORY_CARE_PROVIDER_SITE_OTHER): Payer: BC Managed Care – PPO | Admitting: Obstetrics & Gynecology

## 2017-01-22 ENCOUNTER — Encounter: Payer: Self-pay | Admitting: Obstetrics & Gynecology

## 2017-01-22 VITALS — BP 132/80 | Ht 61.0 in | Wt 168.0 lb

## 2017-01-22 DIAGNOSIS — N951 Menopausal and female climacteric states: Secondary | ICD-10-CM | POA: Diagnosis not present

## 2017-01-22 DIAGNOSIS — Z1151 Encounter for screening for human papillomavirus (HPV): Secondary | ICD-10-CM

## 2017-01-22 DIAGNOSIS — Z3046 Encounter for surveillance of implantable subdermal contraceptive: Secondary | ICD-10-CM

## 2017-01-22 DIAGNOSIS — Z01419 Encounter for gynecological examination (general) (routine) without abnormal findings: Secondary | ICD-10-CM

## 2017-01-22 DIAGNOSIS — K625 Hemorrhage of anus and rectum: Secondary | ICD-10-CM

## 2017-01-22 MED ORDER — ALPRAZOLAM 0.25 MG PO TABS: 0.2500 mg | ORAL_TABLET | Freq: Every evening | ORAL | 1 refills | Status: DC | PRN

## 2017-01-22 NOTE — Progress Notes (Signed)
Linda Reese Aug 24, 1960 [id number]   History:    56 y.o. G2P2L2  Married.  2 daughters 54 and 20 yo.  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  C/O H/A and Anxiety with occasional dizziness.  Breasts wnl.  Mictions/BMs wnl.  Intermittent mild rectal bleeding which she attributes to her hemorrhoids which she is treating with OTC CS cream.  Stopped Synthroid.  Needs fasting Labs but not fasting today.  Past medical history,surgical history, family history and social history were all reviewed and documented in the EPIC chart.  Gynecologic History Patient's last menstrual period was 06/15/2015. Contraception: Nexplanon Last Pap: 2016. Results were: normal Last mammogram: 07/2015. Results were: Rt normal, Lt Bx Fibroadenoma. Colono 4 yrs ago  Obstetric History OB History  Gravida Para Term Preterm AB Living  '2 2 2     2  ' SAB TAB Ectopic Multiple Live Births          2    # Outcome Date GA Lbr Len/2nd Weight Sex Delivery Anes PTL Lv  2 Term     F Vag-Spont  N LIV  1 Term     F Vag-Spont  N LIV       ROS: A ROS was performed and pertinent positives and negatives are included in the history.  GENERAL: No fevers or chills. HEENT: No change in vision, no earache, sore throat or sinus congestion. NECK: No pain or stiffness. CARDIOVASCULAR: No chest pain or pressure. No palpitations. PULMONARY: No shortness of breath, cough or wheeze. GASTROINTESTINAL: No abdominal pain, nausea, vomiting or diarrhea, melena or bright red blood per rectum. GENITOURINARY: No urinary frequency, urgency, hesitancy or dysuria. MUSCULOSKELETAL: No joint or muscle pain, no back pain, no recent trauma. DERMATOLOGIC: No rash, no itching, no lesions. ENDOCRINE: No polyuria, polydipsia, no heat or cold intolerance. No recent change in weight. HEMATOLOGICAL: No anemia or easy bruising or  bleeding. NEUROLOGIC: No headache, seizures, numbness, tingling or weakness. PSYCHIATRIC: No depression, no loss of interest in normal activity or change in sleep pattern.     Exam:   BP 132/80   Ht '5\' 1"'  (1.549 m)   Wt 168 lb (76.2 kg)   LMP 06/15/2015   BMI 31.74 kg/m   Body mass index is 31.74 kg/m.  General appearance : Well developed well nourished female. No acute distress HEENT: Eyes: no retinal hemorrhage or exudates,  Neck supple, trachea midline, no carotid bruits, no thyroidmegaly Lungs: Clear to auscultation, no rhonchi or wheezes, or rib retractions  Heart: Regular rate and rhythm, no murmurs or gallops Breast:Examined in sitting and supine position were symmetrical in appearance, no palpable masses or tenderness,  no skin retraction, no nipple inversion, no nipple discharge, no skin discoloration, no axillary or supraclavicular lymphadenopathy Abdomen: no palpable masses or tenderness, no rebound or guarding Extremities: no edema or skin discoloration or tenderness  Pelvic: Vulva normal  Bartholin, Urethra, Skene Glands: Within normal limits             Vagina: No gross lesions or discharge  Cervix: No gross lesions or discharge.  Pap/HPV HR done.  Uterus  AV, normal size, shape and consistency, non-tender and mobile  Adnexa  Without masses or tenderness  Anus and perineum  normal     Assessment/Plan:  56 y.o. female for annual exam   1.  Encounter for routine gynecological examination with Papanicolaou smear of cervix Normal gyn exam.  Pap/HPV HR done.  Will schedule screening Mammo.  F/U Fasting Labs. - CBC; Future - Comp Met (CMET); Future - Lipid Profile; Future - TSH; Future - Vitamin D 1,25 dihydroxy; Future  2. Encounter for surveillance of implantable subdermal contraceptive Appointment scheduled for removal.  Probably menopausal x 1 year.  Will proceed with Ona post removal and consider HRT.  3. Menopause syndrome Will discuss after removal of  Nexplanon.  5. Rectal bleeding Refer to Gastro-Enterology.  Counseling on above issues >50% x 10 minutes.  Princess Bruins MD, 2:21 PM 01/22/2017

## 2017-01-23 ENCOUNTER — Telehealth: Payer: Self-pay | Admitting: *Deleted

## 2017-01-23 DIAGNOSIS — Z1211 Encounter for screening for malignant neoplasm of colon: Secondary | ICD-10-CM

## 2017-01-23 NOTE — Patient Instructions (Signed)
1. Encounter for routine gynecological examination with Papanicolaou smear of cervix Normal gyn exam.  Pap/HPV HR done.  Will schedule screening Mammo.  F/U Fasting Labs. - CBC; Future - Comp Met (CMET); Future - Lipid Profile; Future - TSH; Future - Vitamin D 1,25 dihydroxy; Future  2. Encounter for surveillance of implantable subdermal contraceptive Appointment scheduled for removal.  Probably menopausal x 1 year.  Will proceed with [place] post removal and consider HRT.  3. Menopause syndrome Will discuss after removal of Nexplanon.  5. Rectal bleeding Refer to Gastro-Enterology.  Linda Reese, it was a pleasure meeting you today!  I will inform you of your results as soon as available.   Mantenimiento de la salud de las mujeres posmenopusicas (Health Maintenance for Postmenopausal Women) La menopausia es un proceso normal en el cual se pierde la capacidad reproductiva. Este proceso ocurre gradualmente a lo largo de un perodo de meses o aos, por lo general entre los 78 y los 55aos. La menopausia es completa cuando no se han tenido 19mnstruaciones consecutivas. Es importante hablar con el mdico sobre algunas de las enfermedades ms comunes que afectan a las mujeres posmenopusicas, como la cardiopata coronaria, el cncer y la prdida de la masa sea (osteoporosis). Adoptar un estilo de vida saludable y recibir atencin preventiva pueden ayudar a promover la salud y Linda Reese Adems, estas medidas pueden reducir las probabilidades de desarrollar algunas de estas enfermedades frecuentes. Linda Reese Reese le mDe Linda Reese puede tener una serie de sntomas, por ejemplo:  Calores repentinos moderados a graves.  Sudoracin nocturna.  Disminucin del deseo sexual.  Cambios en el estado de nimo.  Dolores de Linda Reese  Cansancio.  Irritabilidad.  Problemas de memoria.  Insomnio. Tratar o no los cambios que ocurren en la menopausia es una decisin  personal que se toma con el mdico. Linda DEBO SABER SOBRE LOS TRATAMIENTOS DE REPOSICIN HORMONAL Y LOS SUPLEMENTOS? Los productos para la terapia hormonal son eficaces para tratar los sntomas que se asocian con la menopausia, como los calores repentinos y las sudoraciones nocturnas. La reposicin hormonal conlleva ciertos riesgos, especialmente a medida que una mujer envejece. Si est pensando en usar tratamientos con estrgeno o estrgeno con progesterona, analice los beneficios y los riesgos con el mdico. Linda DEBO SABER Linda Reese A medida que una persona envejece, aumenta la probabilidad de tener cardiopata coronaria, infarto de miocardio e ictus. Esto puede deberse, en parte, a los cambios hormonales que atraviesa el cuerpo Reese la menopausia. Estos cambios pueden afectar la forma en que el organismo procesa las Linda Reese los triglicridos y el colesterol de su dieta. El infarto de miocardio y el ictus son emergencias mdicas. Hay muchas cosas que se pueden hacer para ayudar a prevenir la cardiopata coronaria y el ictus:  Debe controlar su presin arterial al menos cada uno o dQuantico Base La hipertensin arterial causa enfermedades cardacas y aSerbiael riesgo de ictus.  Si tiene entre 587y 79aos, consulte al mdico si debe tomar aspirina para prevenir un infarto de miocardio o un ictus.  No consuma ningn producto que contenga tabaco, lo que incluye cigarrillos, tabaco de Linda Reese Si necesita ayuda para dejar de fumar, consulte al Linda Reese  Es importante seguir una dieta sana y mTheatre managerun peso saludable. ? Linda Reese, frutas, productos lcteos de bajo contenido de Linda Reese pAdvertising account planner ? No consuma alimentos con alto contenido de grasas slidas, azcares agregados o  sal (sodio).  Realice actividad fsica con regularidad. Esta es una de las cosas ms importantes que puede hacer por su salud. ? Intente  realizar al menos 18mnutos de actividad fsica por semana. El tipo de ejercicio que realice debe aumentar la frecuencia cardaca y hacerla sudar. Esto se conoce como ejercicio de i[place] ? Intente hacer ejercicios de elongacin por lo menos dos veces por semana. Agrguelos al plan de ejercicio de intensidad moderada.  Conozca sus cifras. Pdale al mdico que le controle el colesterol y el nivel sanguneo de glucosa. Siga hacindose anlisis de sAmerican Electric Powerse lo haya indicado el mdico. Linda DEBO SABER SOBRE LAS PRUEBAS DE DETECCIN DEL CNCER? Hay varios tipos de cncer. Tome las siguientes medidas para reducir el riesgo y dActuaryformacin cancerosa lo antes posible. Cncer de mama  Practique la autoconciencia de la mama. ? Esto significa reconocer la apariencia normal de sus mamas y cmo las siente. ? Tambin significa realizar autoexmenes regulares de las mCastle [place]ne Informe a su mdico sobre cualquier cambio, sin importar cun pequeo sea.  Si es mayor de 40aos, visite a un mdico para qPublic librarianun examen de las mamas (exploracin clnica mamaria o ECM) todos los aPhilomath En funcin de sToysRus los antecedentes familiares y la historia cMarston tal vez sea recomendable que tambin se haga una radiografa anual de las mamas (Linda Reese.  Si tiene antecedentes familiares de cncer de mama, hable con el mdico para someterse a un estudio gentico.  Si tiene alto riesgo de pChief Financial Officerde mama, hable con el mdico para hacerse a uPublic house Reese(RM) y uLavinia Reese los aOconomowoc Lake  La evaluacin del gen del cncer de mama (BRCA) se recomienda a las mujeres que tengan familiares con tumores malignos relacionados con el BRCA. Los resultados de la evaluacin determinarn la necesidad de recibir asesoramiento gentico y pBuilding services engineerde deteccin del BRCA1 y el Reese. Los tumores malignos relacionados con el BRCA incluyen estos tipos: ? Mama. Este tipo se presenta en  hombres o mujeres. ? Ovario. ? Trompas. A este tipo tambin se lo llama cncer de trompa de [name] ? Cncer de la pared abdominal o plvica (cncer de peritoneo). ? Prstata. ? Pncreas. Cncer de cuello uterino, de tero y de ovario El mdico puede recomendarle que se haga pruebas peridicas de deteccin de cncer de los rganos de la pelvis, los cuales iVerizonovarios, el tero y la vagina. Estas pruebas incluyen un examen plvico, que abarca controlar si se produjeron cambios microscpicos en la superficie del cuello del tero (prueba de Papanicolaou).  A las mujeres que tCircuit City21 y 690aos los mdicos pueden recomendarles que se realicen un examen plvico y uArdelia Memsprueba de Papanicolaou cada tres aos. A las mujeres que tienen entre 30 y 65aos, pueden recomendarles la prueba de Papanicolaou y el examen plvico, en combinacin con una prueba de deteccin del virus del papiloma humano (VPH) cWinnebago Algunos tipos de VPH aumentan el riesgo de pChief Financial Officerde cuello del tero. La prueba para la deteccin del VPH tambin puede realizarse a mujeres de cualquier edad cuyos resultados de la prueba de Papanicolaou no sean claros.  Es posible que otros mdicos no recomienden exmenes de deteccin a las mujeres no embarazadas que se consideran sujetos de bajo riesgo de pChief Financial Officerde pelvis y no tienen sntomas. Pregntele al mdico si un examen plvico de deteccin es adecuado para usted.  Si ha recibido un tratamiento para eMerchandiser, retail  o una enfermedad que podra causar cncer, necesitar realizarse una prueba de Papanicolaou y controles Reese al menos 72 aos de concluido el Salida. Si no se ha hecho el Papanicolaou con regularidad, debern volver a evaluarse los factores de riesgo (como tener un nuevo compaero sexual), para Teacher, adult education si debe empezar a Dispensing optician los estudios nuevamente. Algunas mujeres sufren problemas mdicos que aumentan la probabilidad de  Museum/gallery curator cncer de cuello del tero. En estos casos, el mdico podr [employer] se realicen controles y pruebas de Papanicolaou con ms frecuencia.  Si tiene antecedentes familiares de cncer de tero o de ovario, hable con el mdico para someterse a un estudio gentico.  Si tiene hemorragia vaginal despus de la menopausia, informe al mdico.  En la actualidad, no hay pruebas confiables para la deteccin del cncer de ovario. Cncer de pulmn Se recomienda realizar exmenes de deteccin de cncer de pulmn a personas adultas entre 30 y 86 aos que estn en riesgo de Horticulturist, commercial de pulmn por sus antecedentes de consumo de tabaco. Se recomienda una tomografa computarizada (TC) de baja dosis de los pulmones todos los aos si usted:  Fuma actualmente.  Ha fumado Reese 30aos un paquete diario y sigue fumando o dej el hbito en algn momento en los ltimos 15aos. Un paquete-ao equivale a fumar en promedio un paquete de cigarrillos diario Reese un ao. Los exmenes de deteccin anuales:  Deben hacerse hasta que hayan pasado 15aos desde que dej de fumar.  Deben dejar de realizarse si tiene un problema de salud que le impide recibir tratamiento para el cncer de pulmn. Cncer colorrectal  Este tipo de cncer puede detectarse y a menudo prevenirse.  El estudio de [place] de Programme researcher, broadcasting/film/video del cncer colorrectal debe comenzar a Electrical engineer a Proofreader de los 41aos y [place].  El mdico puede aconsejarle que lo haga antes, si tiene factores de riesgo de Best boy cncer de colon.  Si tiene antecedentes familiares de cncer colorrectal, hable con el mdico para someterse a un estudio gentico.  El mdico tambin puede recomendarle que use un kit de prueba para Engineer, mining a fin de Educational psychologist en la materia fecal.  Es posible que se use una pequea cmara en el extremo de un tubo para examinar directamente el colon (sigmoidoscopia o colonoscopia) a fin de Hydrographic surveyor  formas tempranas de cncer colorrectal.  El examen directo del colon se debe repetir cada 5 a 10aos hasta los 47aos. Sin embargo, si se hallan formas incipientes de plipos precancerosos o pequeos tumores, o si tiene antecedentes familiares o riesgo gentico de Therapist, music, debe realizarse exmenes de deteccin con ms frecuencia. Cncer de piel  Revise la piel de la cabeza a los pies con regularidad.  Contrlese los lunares. Infrmele al mdico: ? Si aparecen nuevos lunares o los que tiene se modifican, especialmente en su forma o color. ? Si tiene un lunar que es ms grande que el tamao de una goma de Games developer.  Si alguno de los miembros de su familia tiene antecedentes de cncer de piel, especialmente a una edad temprana, hable con el mdico para someterse a pruebas genticas.  Siempre use pantalla solar. Aplique pantalla solar de Kerry Dory y repetida a lo largo del Training and development officer.  Protjase usando mangas y [employer], un sombrero de ala ancha y gafas para el sol, siempre que est al [place]. Linda DEBO SABER SOBRE LA OSTEOPOROSIS? La osteoporosis es una afeccin en la cual la destruccin de la masa  sea ocurre con mayor rapidez que su formacin. Despus de la menopausia, puede correr un riesgo ms alto de tener osteoporosis. Para ayudar a prevenir esta afeccin o las fracturas seas que pueden ocurrir a causa de [place], se recomienda lo siguiente:  Si tiene entre 19 y 50aos, tome como mnimo 1064m de calcio y 6068mde vitaminaD por daTraining and development officer Si es mayor de 50aos pero menor de 70aos, tome como mnimo 120074me calcio y 600m70m vitaminaD por da. Training and development officeri es mayor de 70aos, tome como mnimo 1200mg43mcalcio y 800mg 73mitaminaD por da. ElTraining and development officerabaquismo y el consumo excesivo de alcohol aumentan el riesgo de osteoporosis. Consuma alimentos ricos en calcio y vitaminaD, y haga ejercicios con soporte de peso varias veces a la semana, como se lo haya indicado el  mdico. Linda DEBO SABER SOBRE EL MODO EN QUE LA MENOPAUSIA AFECTA MI SALUD MENTAL? La depresin puede presentarse a cualquier edad, pero es ms frecuente a medida que una persona envejece. Los sntomas comunes de depresin incluyen lo siguiente:  Desnimo o tristeza.  Cambios en los patrones de sueo.  Cambios en el apetito o en los hbitos de alimentacin.  Sensacin de falta general de motivacin o placer al realizYahooidades que sola disfrutar.  Crisis frecuentes de llanto. Hable con el mdico si cree que tiene depresin. Linda DEBO SABER SOBRE LAS VACUNAS? Es importante que se aplique las vacunas y las maGuide Rocks incluyen las siguientes:  Vacuna contra el ttanos, la difteria y la tosferina (Tdap).  Vacuna anual contra la gripe antes del inicio de la temporada de gripe.  Vacuna contra la neumona.  Vacuna contra el herpes. El mdico tambin puede recomendarle que se aplique otras vacunas. Esta informacin no tiene como fMarine scientistnsejo del mdico. Linda de hacerle al mdico cualquier pregunta que tenga. Document Released: 12/31/2012 Document Revised: 04/02/2014 Document Reviewed: 12/14/2014 Elsevier Interactive Patient Education  2018 [employer] Linda

## 2017-01-23 NOTE — Telephone Encounter (Signed)
Referral placed at [facility] they will contact pt to schedule. 

## 2017-01-23 NOTE — Telephone Encounter (Signed)
-----   Message from Princess Bruins, MD sent at 01/22/2017  2:48 PM EDT ----- Regarding:  Refer to Gastro-Enterology Rectal bleeding.

## 2017-01-24 NOTE — Telephone Encounter (Signed)
[facility] has called and left message with patient to schedule.

## 2017-01-25 LAB — PAP, TP IMAGING W/ HPV RNA, RFLX HPV TYPE 16,18/45: HPV DNA HIGH RISK: NOT DETECTED

## 2017-01-28 ENCOUNTER — Ambulatory Visit: Payer: BC Managed Care – PPO | Admitting: Obstetrics & Gynecology

## 2017-01-28 ENCOUNTER — Encounter: Payer: Self-pay | Admitting: Obstetrics & Gynecology

## 2017-01-28 VITALS — BP 126/78

## 2017-01-28 DIAGNOSIS — Z3046 Encounter for surveillance of implantable subdermal contraceptive: Secondary | ICD-10-CM | POA: Diagnosis not present

## 2017-01-28 DIAGNOSIS — Z780 Asymptomatic menopausal state: Secondary | ICD-10-CM

## 2017-01-28 NOTE — Patient Instructions (Signed)
1. Encounter for Nexplanon removal Easy removal of Nexplanon.  Instructions given.  2. Menopause Probably menopausal.  Will f/u in 1 week for [facility].  Will also do Fasting Health labs at same time (already ordered  CBC,CMP, FLP, TSH, Vit D) - FSH; Future  Informed of Pap neg/HPV HR neg.

## 2017-01-28 NOTE — Progress Notes (Signed)
    MARLOW HENDRIE 25-Sep-1960 [id number]        56 y.o.  G2P2002  Married  RP:  Nexplanon due for removal  HPI:  Probably menopausal.  Past medical history,surgical history, problem list, medications, allergies, family history and social history were all reviewed and documented in the EPIC chart.  Directed ROS with pertinent positives and negatives documented in the history of present illness/assessment and plan.  Exam:  Vitals:   01/28/17 1555  BP: 126/78   General appearance:  Normal                                                             Nexplanon procedure note (removal)  The patient presented to the office today requesting for removal of her Nexplanon that was placed in the year 2015 on her left arm.   On examination the nexplanon implant was palpated and the distal end  (end  closest to the elbow) was marked. The area was sterilized with Betadine solution. 1% lidocaine was used for local anesthesia and approximately 1 cc  was injected into the site that was marked where  the incision was to be made. The local anesthetic was injected under the implant in an effort to keep it  close to the skin surface. Slight pressure pushing downward was made at the proximal end  of the implant in an effort to stabilize it. A bulge appeared indicating the distal end of the implant. Starting at the distal tip of the implant, a small transverse incision of 2 mm was made. By gently pushing the implant toward the incision the tip became visible. Grasping the implant with a curved forcep facilitated in gently removing the implant. Full confirmation of the entire implant which is 4 cm long was inspected and was intact and was shown to the patient and discarded. After removing the implant, the incision was closed with Steri-Strip, a bandaid and applying a bandage. Patient will be instructed to remove the pressure bandage in 24 hours, the band-aid in 3 days and the Steri-Strips in 7  days.   Assessment/Plan:  56 y.o. G2P2002   1. Encounter for Nexplanon removal Easy removal of Nexplanon.  Instructions given.  2. Menopause Probably menopausal.  Will f/u in 1 week for [facility].  Will also do Fasting Health labs at same time (already ordered  CBC,CMP, FLP, TSH, Vit D) - FSH; Future  Informed of Pap neg/HPV HR neg.  Princess Bruins MD, 4:09 PM 01/28/2017

## 2017-02-04 ENCOUNTER — Other Ambulatory Visit: Payer: BC Managed Care – PPO

## 2017-02-04 DIAGNOSIS — Z01419 Encounter for gynecological examination (general) (routine) without abnormal findings: Secondary | ICD-10-CM

## 2017-02-04 DIAGNOSIS — Z780 Asymptomatic menopausal state: Secondary | ICD-10-CM

## 2017-02-05 ENCOUNTER — Other Ambulatory Visit: Payer: Self-pay

## 2017-02-05 LAB — FOLLICLE STIMULATING HORMONE: FSH: 42 m[IU]/mL

## 2017-02-07 LAB — CBC
HEMATOCRIT: 39.2 % (ref 35.0–45.0)
Hemoglobin: 13.7 g/dL (ref 11.7–15.5)
MCH: 32.9 pg (ref 27.0–33.0)
MCHC: 34.9 g/dL (ref 32.0–36.0)
MCV: 94.2 fL (ref 80.0–100.0)
MPV: 9.8 fL (ref 7.5–12.5)
Platelets: 235 10*3/uL (ref 140–400)
RBC: 4.16 10*6/uL (ref 3.80–5.10)
RDW: 12.3 % (ref 11.0–15.0)
WBC: 4.8 10*3/uL (ref 3.8–10.8)

## 2017-02-07 LAB — LIPID PANEL
Cholesterol: 172 mg/dL (ref <200)
HDL: 41 mg/dL — AB (ref >50)
LDL Cholesterol (Calc): 111 mg/dL (calc) — ABNORMAL HIGH
Non-HDL Cholesterol (Calc): 131 mg/dL (calc) — ABNORMAL HIGH (ref <130)
TRIGLYCERIDES: 102 mg/dL (ref <150)
Total CHOL/HDL Ratio: 4.2 (calc) (ref <5.0)

## 2017-02-07 LAB — VITAMIN D 1,25 DIHYDROXY
VITAMIN D3 1, 25 (OH): 55 pg/mL
Vitamin D 1, 25 (OH)2 Total: 55 pg/mL (ref 18–72)

## 2017-02-07 LAB — COMPREHENSIVE METABOLIC PANEL
AG Ratio: 1.5 (calc) (ref 1.0–2.5)
ALKALINE PHOSPHATASE (APISO): 95 U/L (ref 33–130)
ALT: 29 U/L (ref 6–29)
AST: 24 U/L (ref 10–35)
Albumin: 4.1 g/dL (ref 3.6–5.1)
BUN: 15 mg/dL (ref 7–25)
CALCIUM: 9.2 mg/dL (ref 8.6–10.4)
CHLORIDE: 107 mmol/L (ref 98–110)
CO2: 27 mmol/L (ref 20–32)
CREATININE: 0.7 mg/dL (ref 0.50–1.05)
Globulin: 2.8 g/dL (calc) (ref 1.9–3.7)
Glucose, Bld: 103 mg/dL — ABNORMAL HIGH (ref 65–99)
Potassium: 4.2 mmol/L (ref 3.5–5.3)
Sodium: 140 mmol/L (ref 135–146)
Total Bilirubin: 0.8 mg/dL (ref 0.2–1.2)
Total Protein: 6.9 g/dL (ref 6.1–8.1)

## 2017-02-07 LAB — TEST AUTHORIZATION

## 2017-02-07 LAB — T4, FREE: Free T4: 0.9 ng/dL (ref 0.8–1.8)

## 2017-02-07 LAB — T3, FREE: T3 FREE: 2.8 pg/mL (ref 2.3–4.2)

## 2017-02-07 LAB — TSH: TSH: 5.24 m[IU]/L — AB

## 2017-02-12 ENCOUNTER — Other Ambulatory Visit: Payer: Self-pay | Admitting: Obstetrics & Gynecology

## 2017-02-12 DIAGNOSIS — R7989 Other specified abnormal findings of blood chemistry: Secondary | ICD-10-CM

## 2017-02-13 ENCOUNTER — Other Ambulatory Visit: Payer: Self-pay

## 2017-02-13 ENCOUNTER — Encounter: Payer: Self-pay | Admitting: Emergency Medicine

## 2017-02-13 ENCOUNTER — Ambulatory Visit: Payer: BC Managed Care – PPO | Admitting: Emergency Medicine

## 2017-02-13 ENCOUNTER — Other Ambulatory Visit: Payer: Self-pay | Admitting: Obstetrics & Gynecology

## 2017-02-13 VITALS — BP 118/68 | HR 86 | Temp 98.6°F | Resp 16 | Ht 61.42 in | Wt 172.0 lb

## 2017-02-13 DIAGNOSIS — M2550 Pain in unspecified joint: Secondary | ICD-10-CM | POA: Diagnosis not present

## 2017-02-13 DIAGNOSIS — R42 Dizziness and giddiness: Secondary | ICD-10-CM

## 2017-02-13 DIAGNOSIS — Z840 Family history of diseases of the skin and subcutaneous tissue: Secondary | ICD-10-CM

## 2017-02-13 DIAGNOSIS — R7309 Other abnormal glucose: Secondary | ICD-10-CM

## 2017-02-13 DIAGNOSIS — M791 Myalgia, unspecified site: Secondary | ICD-10-CM

## 2017-02-13 MED ORDER — MECLIZINE HCL 25 MG PO TABS: 25.0000 mg | ORAL_TABLET | Freq: Three times a day (TID) | ORAL | 0 refills | Status: DC | PRN

## 2017-02-13 NOTE — Patient Instructions (Addendum)
   IF you received an x-ray today, you will receive an invoice from [facility]. Please contact [facility] at [phone] with questions or concerns regarding your invoice.   IF you received labwork today, you will receive an invoice from [facility]. Please contact [facility] at [phone] with questions or concerns regarding your invoice.   Our billing staff will not be able to assist you with questions regarding bills from these companies.  You will be contacted with the lab results as soon as they are available. The fastest way to get your results is to activate your My Chart account. Instructions are located on the last page of this paperwork. If you have not heard from us regarding the results in 2 weeks, please contact this office.    Dizziness Dizziness is a common problem. It makes you feel unsteady or light-headed. You may feel like you are about to pass out (faint). Dizziness can lead to getting hurt if you stumble or fall. Dizziness can be caused by many things, including:  Medicines.  Not having enough water in your body (dehydration).  Illness.  Follow these instructions at home: Eating and drinking  Drink enough fluid to keep your pee (urine) clear or pale yellow. This helps to keep you from getting dehydrated. Try to drink more clear fluids, such as water.  Do not drink alcohol.  Limit how much caffeine you drink or eat, if your doctor tells you to do that.  Limit how much salt (sodium) you drink or eat, if your doctor tells you to do that. Activity  Avoid making quick movements. ? When you stand up from sitting in a chair, steady yourself until you feel okay. ? In the morning, first sit up on the side of the bed. When you feel okay, stand slowly while you hold onto something. Do this until you know that your balance is fine.  If you need to stand in one place for a long time, move your legs often. Tighten and relax the muscles in your legs  while you are standing.  Do not drive or use heavy machinery if you feel dizzy.  Avoid bending down if you feel dizzy. Place items in your home so you can reach them easily without leaning over. Lifestyle  Do not use any products that contain nicotine or tobacco, such as cigarettes and e-cigarettes. If you need help quitting, ask your doctor.  Try to lower your stress level. You can do this by using methods such as yoga or meditation. Talk with your doctor if you need help. General instructions  Watch your dizziness for any changes.  Take over-the-counter and prescription medicines only as told by your doctor. Talk with your doctor if you think that you are dizzy because of a medicine that you are taking.  Tell a friend or a family member that you are feeling dizzy. If he or she notices any changes in your behavior, have this person call your doctor.  Keep all follow-up visits as told by your doctor. This is important. Contact a doctor if:  Your dizziness does not go away.  Your dizziness or light-headedness gets worse.  You feel sick to your stomach (nauseous).  You have trouble hearing.  You have new symptoms.  You are unsteady on your feet.  You feel like the room is spinning. Get help right away if:  You throw up (vomit) or have watery poop (diarrhea), and you cannot eat or drink anything.  You have trouble: ?   Talking. ? Walking. ? Swallowing. ? Using your arms, hands, or legs.  You feel generally weak.  You are not thinking clearly, or you have trouble forming sentences. A friend or family member may notice this.  You have: ? Chest pain. ? Pain in your belly (abdomen). ? Shortness of breath. ? Sweating.  Your vision changes.  You are bleeding.  You have a very bad headache.  You have neck pain or a stiff neck.  You have a fever. These symptoms may be an emergency. Do not wait to see if the symptoms will go away. Get medical help right away. Call  your local emergency services (911 in the [place] Do not drive yourself to the hospital. Summary  Dizziness makes you feel unsteady or light-headed. You may feel like you are about to pass out (faint).  Drink enough fluid to keep your pee (urine) clear or pale yellow. Do not drink alcohol.  Avoid making quick movements if you feel dizzy.  Watch your dizziness for any changes. This information is not intended to replace advice given to you by your health care provider. Make sure you discuss any questions you have with your health care provider. Document Released: 03/01/2011 Document Revised: 03/29/2016 Document Reviewed: 03/29/2016 Elsevier Interactive Patient Education  2017 Elsevier Inc.  

## 2017-02-13 NOTE — Progress Notes (Signed)
Linda Reese 56 y.o.   Chief Complaint  Patient presents with  . Headache    with dizziness since May. Pt states she feels she may have vertigo.   . Muscle Pain    all joints are giving her pain     HISTORY OF PRESENT ILLNESS: This is a 56 y.o. female complaining of dizziness on and off since last May; changes with position. Also c/o diffuse muscle and joint pains x years; post-menopausal.  Dizziness  This is a recurrent problem. The current episode started more than 1 month ago. The problem occurs intermittently. The problem has been waxing and waning. Associated symptoms include arthralgias, fatigue, headaches, myalgias and vertigo. Pertinent negatives include no abdominal pain, chest pain, chills, congestion, coughing, diaphoresis, fever, nausea, neck pain, numbness, rash, sore throat, swollen glands, visual change, vomiting or weakness. The symptoms are aggravated by standing. She has tried nothing for the symptoms.  Pertinent labs & imaging results that were available during my care of the patient were reviewed by me and considered in my medical decision making (see chart for details).    Prior to Admission medications   Medication Sig Start Date End Date Taking? Authorizing Provider  ALPRAZolam (XANAX) 0.25 MG tablet Take 1 tablet (0.25 mg total) by mouth at bedtime as needed for anxiety. 01/22/17  Yes Princess Bruins, MD    No Known Allergies  Patient Active Problem List   Diagnosis Date Noted  . Thyroid activity decreased 06/10/2014  . Pain in joint, upper arm 06/10/2014  . Other specified hypothyroidism 04/27/2014  . Anxiety 04/27/2014  . Nexplanon in place 11/13/2013  . Hot flushes, perimenopausal 10/30/2013  . Hemorrhoids 05/09/2012  . Unspecified constipation 05/09/2012  . Malar rash 05/09/2012  . 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 Family history of lupus erythematosus 05/09/2012  . DYSPEPSIA 07/06/2008  . FATIGUE 07/06/2008  . WEIGHT GAIN 07/06/2008  . B12  DEFICIENCY 11/24/2007  . ANXIETY 08/26/2007  . MUSCLE PAIN 08/26/2007    Past Medical History:  Diagnosis Date  . Allergy   . Anxiety   . Chronic headaches   . Hemorrhoids   . Hypothyroidism   . Vitamin D deficiency     Past Surgical History:  Procedure Laterality Date  . nexplanon  07/03/2010   insertion   . OVARY BIOPSY      Social History   Socioeconomic History  . Marital status: Married    Spouse name: Not on file  . Number of children: Not on file  . Years of education: Not on file  . Highest education level: Not on file  Social Needs  . Financial resource strain: Not on file  . Food insecurity - worry: Not on file  . Food insecurity - inability: Not on file  . Transportation needs - medical: Not on file  . Transportation needs - non-medical: Not on file  Occupational History  . Not on file  Tobacco Use  . Smoking status: Never Smoker  . Smokeless tobacco: Never Used  Substance and Sexual Activity  . Alcohol use: Yes    Alcohol/week: 0.0 oz    Comment: "sometimes" weekend wine and margarita  . Drug use: No  . Sexual activity: Yes    Birth control/protection: Inserts    Comment: nexplanon. 1st intercourse- 26, partners- 1  Other Topics Concern  . Not on file  Social History Narrative  . Not on file    Family History  Problem Relation Age of Onset  . Diabetes Maternal Grandmother   .  Cancer Maternal Grandmother        stomach  . Heart disease Maternal Grandfather   . Hyperlipidemia Mother   . Lupus Father   . Anemia Father   . COPD Father   . Hyperlipidemia Father   . Colon cancer Neg Hx      Review of Systems  Constitutional: Positive for fatigue. Negative for chills, diaphoresis, fever and weight loss.  HENT: Negative.  Negative for congestion, ear pain, hearing loss, nosebleeds, sinus pain and sore throat.   Eyes: Negative.  Negative for blurred vision, double vision, pain, discharge and redness.  Respiratory: Negative for cough.    Cardiovascular: Negative for chest pain, palpitations, claudication and leg swelling.  Gastrointestinal: Negative for abdominal pain, nausea and vomiting.       +hemorrhoid  Genitourinary: Negative for dysuria and hematuria.       Menopausal  Musculoskeletal: Positive for arthralgias and myalgias. Negative for neck pain.  Skin: Negative for rash.  Neurological: Positive for dizziness, vertigo and headaches. Negative for weakness and numbness.  Endo/Heme/Allergies: Negative.   Psychiatric/Behavioral: Negative for depression. The patient is not nervous/anxious.   All other systems reviewed and are negative.  Vitals:   02/13/17 1329  BP: 118/68  Pulse: 86  Resp: 16  Temp: 98.6 F (37 C)  SpO2: 99%     Physical Exam  Constitutional: She is oriented to person, place, and time. She appears well-developed and well-nourished.  HENT:  Head: Normocephalic and atraumatic.  Right Ear: External ear normal.  Left Ear: External ear normal.  Nose: Nose normal.  Mouth/Throat: Oropharynx is clear and moist.  Eyes: Conjunctivae and EOM are normal. Pupils are equal, round, and reactive to light.  Neck: Normal range of motion. Neck supple. No JVD present. No thyromegaly present.  Cardiovascular: Normal rate, regular rhythm and normal heart sounds.  Pulmonary/Chest: Effort normal and breath sounds normal.  Abdominal: Soft. Bowel sounds are normal. She exhibits no distension. There is no tenderness.  Musculoskeletal: Normal range of motion. She exhibits no edema or tenderness.  Lymphadenopathy:    She has no cervical adenopathy.  Neurological: She is alert and oriented to person, place, and time. She displays normal reflexes. No cranial nerve deficit or sensory deficit. She exhibits normal muscle tone. Coordination normal.  Skin: Skin is warm and dry. Capillary refill takes less than 2 seconds. No rash noted.  Psychiatric: She has a normal mood and affect. Her behavior is normal.  Vitals  reviewed.    ASSESSMENT & PLAN: Dreamer was seen today for headache and muscle pain.  Diagnoses and all orders for this visit:  Dizziness -     Hemoglobin A1c -     ANA,IFA RA Diag Pnl w/rflx Tit/Patn -     Sedimentation Rate -     meclizine (ANTIVERT) 25 MG tablet; Take 1 tablet (25 mg total) by mouth 3 (three) times daily as needed for dizziness.  Arthralgia, unspecified joint -     Hemoglobin A1c -     ANA,IFA RA Diag Pnl w/rflx Tit/Patn -     Sedimentation Rate  Myalgia -     Hemoglobin A1c -     ANA,IFA RA Diag Pnl w/rflx Tit/Patn -     Sedimentation Rate  Family history of lupus erythematosus    Patient Instructions       IF you received an x-ray today, you will receive an invoice from [facility]. Please contact [facility] at [phone] with questions or concerns regarding your  invoice.   IF you received labwork today, you will receive an invoice from [facility]. Please contact [facility] at [phone] with questions or concerns regarding your invoice.   Our billing staff will not be able to assist you with questions regarding bills from these companies.  You will be contacted with the lab results as soon as they are available. The fastest way to get your results is to activate your My Chart account. Instructions are located on the last page of this paperwork. If you have not heard from [place] regarding the results in 2 weeks, please contact this office.     Dizziness Dizziness is a common problem. It makes you feel unsteady or light-headed. You may feel like you are about to pass out (faint). Dizziness can lead to getting hurt if you stumble or fall. Dizziness can be caused by many things, including:  Medicines.  Not having enough water in your body (dehydration).  Illness.  Follow these instructions at home: Eating and drinking  Drink enough fluid to keep your pee (urine) clear or pale yellow. This helps to keep you from getting  dehydrated. Try to drink more clear fluids, such as water.  Do not drink alcohol.  Limit how much caffeine you drink or eat, if your doctor tells you to do that.  Limit how much salt (sodium) you drink or eat, if your doctor tells you to do that. Activity  Avoid making quick movements. ? When you stand up from sitting in a chair, steady yourself until you feel okay. ? In the morning, first sit up on the side of the bed. When you feel okay, stand slowly while you hold onto something. Do this until you know that your balance is fine.  If you need to stand in one place for a long time, move your legs often. Tighten and relax the muscles in your legs while you are standing.  Do not drive or use heavy machinery if you feel dizzy.  Avoid bending down if you feel dizzy. Place items in your home so you can reach them easily without leaning over. Lifestyle  Do not use any products that contain nicotine or tobacco, such as cigarettes and e-cigarettes. If you need help quitting, ask your doctor.  Try to lower your stress level. You can do this by using methods such as yoga or meditation. Talk with your doctor if you need help. General instructions  Watch your dizziness for any changes.  Take over-the-counter and prescription medicines only as told by your doctor. Talk with your doctor if you think that you are dizzy because of a medicine that you are taking.  Tell a friend or a family member that you are feeling dizzy. If he or she notices any changes in your behavior, have this person call your doctor.  Keep all follow-up visits as told by your doctor. This is important. Contact a doctor if:  Your dizziness does not go away.  Your dizziness or light-headedness gets worse.  You feel sick to your stomach (nauseous).  You have trouble hearing.  You have new symptoms.  You are unsteady on your feet.  You feel like the room is spinning. Get help right away if:  You throw up (vomit)  or have watery poop (diarrhea), and you cannot eat or drink anything.  You have trouble: ? Talking. ? Walking. ? Swallowing. ? Using your arms, hands, or legs.  You feel generally weak.  You are not thinking clearly, or you have  trouble forming sentences. A friend or family member may notice this.  You have: ? Chest pain. ? Pain in your belly (abdomen). ? Shortness of breath. ? Sweating.  Your vision changes.  You are bleeding.  You have a very bad headache.  You have neck pain or a stiff neck.  You have a fever. These symptoms may be an emergency. Do not wait to see if the symptoms will go away. Get medical help right away. Call your local emergency services (911 in the [place] Do not drive yourself to the hospital. Summary  Dizziness makes you feel unsteady or light-headed. You may feel like you are about to pass out (faint).  Drink enough fluid to keep your pee (urine) clear or pale yellow. Do not drink alcohol.  Avoid making quick movements if you feel dizzy.  Watch your dizziness for any changes. This information is not intended to replace advice given to you by your health care provider. Make sure you discuss any questions you have with your health care provider. Document Released: 03/01/2011 Document Revised: 03/29/2016 Document Reviewed: 03/29/2016 Elsevier Interactive Patient Education  2017 [employer]vier Inc[employer]      Agustina Caroli, MD [facility]

## 2017-02-15 LAB — SEDIMENTATION RATE: SED RATE: 17 mm/h (ref 0–40)

## 2017-02-15 LAB — FANA STAINING PATTERNS: Speckled Pattern: 1:160 {titer} — ABNORMAL HIGH

## 2017-02-15 LAB — ANA,IFA RA DIAG PNL W/RFLX TIT/PATN
ANA TITER 1: POSITIVE — AB
CYCLIC CITRULLIN PEPTIDE AB: 10 U (ref 0–19)
Rhuematoid fact SerPl-aCnc: <10 IU/mL (ref 0.0–13.9)

## 2017-02-15 LAB — HEMOGLOBIN A1C
ESTIMATED AVERAGE GLUCOSE: 114 mg/dL
Hgb A1c MFr Bld: 5.6 % (ref 4.8–5.6)

## 2017-02-18 ENCOUNTER — Encounter: Payer: Self-pay | Admitting: Emergency Medicine

## 2017-02-18 ENCOUNTER — Other Ambulatory Visit: Payer: Self-pay | Admitting: Emergency Medicine

## 2017-02-18 DIAGNOSIS — L930 Discoid lupus erythematosus: Secondary | ICD-10-CM

## 2017-02-22 ENCOUNTER — Encounter: Payer: Self-pay | Admitting: Obstetrics & Gynecology

## 2017-05-22 ENCOUNTER — Ambulatory Visit: Payer: BC Managed Care – PPO | Admitting: Emergency Medicine

## 2018-03-14 ENCOUNTER — Encounter: Payer: Self-pay | Admitting: Emergency Medicine

## 2018-03-14 ENCOUNTER — Other Ambulatory Visit: Payer: Self-pay

## 2018-03-14 ENCOUNTER — Ambulatory Visit: Payer: BC Managed Care – PPO | Admitting: Emergency Medicine

## 2018-03-14 VITALS — BP 121/76 | HR 72 | Temp 98.3°F | Resp 20 | Ht 61.18 in | Wt 174.4 lb

## 2018-03-14 DIAGNOSIS — B349 Viral infection, unspecified: Secondary | ICD-10-CM | POA: Diagnosis not present

## 2018-03-14 DIAGNOSIS — R52 Pain, unspecified: Secondary | ICD-10-CM

## 2018-03-14 DIAGNOSIS — R0989 Other specified symptoms and signs involving the circulatory and respiratory systems: Secondary | ICD-10-CM

## 2018-03-14 DIAGNOSIS — E669 Obesity, unspecified: Secondary | ICD-10-CM

## 2018-03-14 DIAGNOSIS — J988 Other specified respiratory disorders: Secondary | ICD-10-CM

## 2018-03-14 DIAGNOSIS — B9789 Other viral agents as the cause of diseases classified elsewhere: Secondary | ICD-10-CM

## 2018-03-14 MED ORDER — PSEUDOEPHEDRINE-GUAIFENESIN ER 60-600 MG PO TB12: 1.0000 | ORAL_TABLET | Freq: Two times a day (BID) | ORAL | 1 refills | Status: AC

## 2018-03-14 MED ORDER — HYDROCODONE-ACETAMINOPHEN 5-325 MG PO TABS: 1.0000 | ORAL_TABLET | Freq: Four times a day (QID) | ORAL | 0 refills | Status: DC | PRN

## 2018-03-14 NOTE — Patient Instructions (Addendum)
If you have lab work done today you will be contacted with your lab results within the next 2 weeks.  If you have not heard from [place] then please contact us. The fastest way to get your results is to register for My Chart.   IF you received an x-ray today, you will receive an invoice from [facility]. Please contact [facility] at [phone] with questions or concerns regarding your invoice.   IF you received labwork today, you will receive an invoice from [facility]. Please contact [facility] at [phone] with questions or concerns regarding your invoice.   Our billing staff will not be able to assist you with questions regarding bills from these companies.  You will be contacted with the lab results as soon as they are available. The fastest way to get your results is to activate your My Chart account. Instructions are located on the last page of this paperwork. If you have not heard from [place] regarding the results in 2 weeks, please contact this office.    We recommend that you schedule a mammogram for breast cancer screening. Typically, you do not need a referral to do this. Please contact a local imaging center to schedule your mammogram.  [facility] - [phone]  *ask for the Radiology Department [facility] - [phone] or [phone]  [facility] - [phone] [place] [phone] [facility] - [phone]  *ask for the [facility] - [phone]  *ask for the Radiology Department [facility] - [phone]  *ask for the [facility] - [phone] Viral Respiratory Infection A viral respiratory infection is an illness that affects parts of the body that are used for breathing. These include the lungs, nose, and throat. It is caused by a germ called a virus. Some examples of this kind of  infection are:  A cold.  The flu (influenza).  A respiratory syncytial virus (RSV) infection. A person who gets this illness may have the following symptoms:  A stuffy or runny nose.  Yellow or green fluid in the nose.  A cough.  Sneezing.  Tiredness (fatigue).  Achy muscles.  A sore throat.  Sweating or chills.  A fever.  A headache. Follow these instructions at home: Managing pain and congestion  Take over-the-counter and prescription medicines only as told by your doctor.  If you have a sore throat, gargle with salt water. Do this 3-4 times per day or as needed. To make a salt-water mixture, dissolve -1 tsp of salt in 1 cup of warm water. Make sure that all the salt dissolves.  Use nose drops made from salt water. This helps with stuffiness (congestion). It also helps soften the skin around your nose.  Drink enough fluid to keep your pee (urine) pale yellow. General instructions   Rest as much as possible.  Do not drink alcohol.  Do not use any products that have nicotine or tobacco, such as cigarettes and e-cigarettes. If you need help quitting, ask your doctor.  Keep all follow-up visits as told by your doctor. This is important. How is this prevented?   Get a flu shot every year. Ask your doctor when you should get your flu shot.  Do not let other people get your germs. If you are sick: ? Stay home from work or school. ? Wash your hands with soap and water often. Wash your  hands after you cough or sneeze. If soap and water are not available, use hand sanitizer.  Avoid contact with people who are sick during cold and flu season. This is in fall and winter. Get help if:  Your symptoms last for 10 days or longer.  Your symptoms get worse over time.  You have a fever.  You have very bad pain in your face or forehead.  Parts of your jaw or neck become very swollen. Get help right away if:  You feel pain or pressure in your chest.  You have  shortness of breath.  You faint or feel like you will faint.  You keep throwing up (vomiting).  You feel confused. Summary  A viral respiratory infection is an illness that affects parts of the body that are used for breathing.  Examples of this illness include a cold, the flu, and respiratory syncytial virus (RSV) infection.  The infection can cause a runny nose, cough, sneezing, sore throat, and fever.  Follow what your doctor tells you about taking medicines, drinking lots of fluid, washing your hands, resting at home, and avoiding people who are sick. This information is not intended to replace advice given to you by your health care provider. Make sure you discuss any questions you have with your health care provider. Document Released: 02/23/2008 Document Revised: 04/22/2017 Document Reviewed: 04/22/2017 Elsevier Interactive Patient Education  2019 [employer].

## 2018-03-14 NOTE — Progress Notes (Signed)
Linda Reese 56 y.o.   Chief Complaint  Patient presents with  . Cough    X 2 weeks- runny nose    HISTORY OF PRESENT ILLNESS: This is a 57 y.o. female complaining of 2-week history of flu like symptoms, complaining of sore throat, runny nose, congestion, cough, generalized achiness, chills.  No other significant symptoms. Requesting blood work complaining of inability to lose weight.  HPI   Prior to Admission medications   Medication Sig Start Date End Date Taking? Authorizing Provider  ALPRAZolam (XANAX) 0.25 MG tablet Take 1 tablet (0.25 mg total) by mouth at bedtime as needed for anxiety. Patient not taking: Reported on 03/14/2018 01/22/17   Princess Bruins, MD  meclizine (ANTIVERT) 25 MG tablet Take 1 tablet (25 mg total) by mouth 3 (three) times daily as needed for dizziness. Patient not taking: Reported on 03/14/2018 02/13/17   Horald Pollen, MD    No Known Allergies  Patient Active Problem List   Diagnosis Date Noted  . Thyroid activity decreased 06/10/2014  . Pain in joint, upper arm 06/10/2014  . Other specified hypothyroidism 04/27/2014  . Anxiety 04/27/2014  . Nexplanon in place 11/13/2013  . Hot flushes, perimenopausal 10/30/2013  . Hemorrhoids 05/09/2012  . Unspecified constipation 05/09/2012  . Malar rash 05/09/2012  . 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 Family history of lupus erythematosus 05/09/2012  . DYSPEPSIA 07/06/2008  . FATIGUE 07/06/2008  . WEIGHT GAIN 07/06/2008  . B12 DEFICIENCY 11/24/2007  . ANXIETY 08/26/2007  . MUSCLE PAIN 08/26/2007    Past Medical History:  Diagnosis Date  . Allergy   . Anxiety   . Chronic headaches   . Hemorrhoids   . Hypothyroidism   . Vitamin D deficiency     Past Surgical History:  Procedure Laterality Date  . nexplanon  07/03/2010   insertion   . OVARY BIOPSY      Social History   Socioeconomic History  . Marital status: Married    Spouse name: Not on file  . Number of children: 2  .  Years of education: Not on file  . Highest education level: Not on file  Occupational History  . Not on file  Social Needs  . Financial resource strain: Not on file  . Food insecurity:    Worry: Not on file    Inability: Not on file  . Transportation needs:    Medical: Not on file    Non-medical: Not on file  Tobacco Use  . Smoking status: Never Smoker  . Smokeless tobacco: Never Used  Substance and Sexual Activity  . Alcohol use: Yes    Alcohol/week: 0.0 standard drinks    Comment: "sometimes" weekend wine and margarita  . Drug use: No  . Sexual activity: Yes    Birth control/protection: Inserts    Comment: nexplanon. 1st intercourse- 26, partners- 1  Lifestyle  . Physical activity:    Days per week: Not on file    Minutes per session: Not on file  . Stress: Not on file  Relationships  . Social connections:    Talks on phone: Not on file    Gets together: Not on file    Attends religious service: Not on file    Active member of club or organization: Not on file    Attends meetings of clubs or organizations: Not on file    Relationship status: Not on file  . Intimate partner violence:    Fear of current or ex partner: Not on file  Emotionally abused: Not on file    Physically abused: Not on file    Forced sexual activity: Not on file  Other Topics Concern  . Not on file  Social History Narrative  . Not on file    Family History  Problem Relation Age of Onset  . Diabetes Maternal Grandmother   . Cancer Maternal Grandmother        stomach  . Heart disease Maternal Grandfather   . Hyperlipidemia Mother   . Lupus Father   . Anemia Father   . COPD Father   . Hyperlipidemia Father   . Colon cancer Neg Hx      Review of Systems  Constitutional: Positive for chills and malaise/fatigue. Negative for fever.  HENT: Positive for congestion and sore throat.   Eyes: Negative.   Respiratory: Positive for cough.   Cardiovascular: Negative for chest pain and  palpitations.  Gastrointestinal: Negative.  Negative for abdominal pain, diarrhea, nausea and vomiting.  Genitourinary: Negative.   Musculoskeletal: Positive for myalgias.  Skin: Negative for rash.  Neurological: Positive for headaches.  Endo/Heme/Allergies: Negative.   All other systems reviewed and are negative.   Vitals:   03/14/18 1513  BP: 121/76  Pulse: 72  Resp: 20  Temp: 98.3 F (36.8 C)  SpO2: 97%    Physical Exam Vitals signs reviewed.  Constitutional:      Appearance: Normal appearance. She is obese.  HENT:     Head: Normocephalic and atraumatic.     Nose: Nose normal.     Mouth/Throat:     Mouth: Mucous membranes are moist.     Pharynx: Oropharynx is clear. No oropharyngeal exudate or posterior oropharyngeal erythema.  Eyes:     Extraocular Movements: Extraocular movements intact.     Conjunctiva/sclera: Conjunctivae normal.     Pupils: Pupils are equal, round, and reactive to light.  Neck:     Musculoskeletal: Normal range of motion.  Cardiovascular:     Rate and Rhythm: Normal rate and regular rhythm.     Pulses: Normal pulses.     Heart sounds: Normal heart sounds.  Pulmonary:     Effort: Pulmonary effort is normal.     Breath sounds: Normal breath sounds.  Abdominal:     Palpations: Abdomen is soft.     Tenderness: There is no abdominal tenderness.  Musculoskeletal: Normal range of motion.  Lymphadenopathy:     Cervical: No cervical adenopathy.  Skin:    General: Skin is warm and dry.     Capillary Refill: Capillary refill takes less than 2 seconds.  Neurological:     General: No focal deficit present.     Mental Status: She is alert and oriented to person, place, and time.  Psychiatric:        Mood and Affect: Mood normal.        Behavior: Behavior normal.      ASSESSMENT & PLAN: [name] was seen today for cough.  Diagnoses and all orders for this visit:  Viral respiratory infection  Obesity, unspecified classification, unspecified  obesity type, unspecified whether serious comorbidity present -     Thyroid Panel With TSH -     Hemoglobin A1c -     Comprehensive metabolic panel -     Amb ref to Medical Nutrition Therapy-MNT  Generalized body aches -     HYDROcodone-acetaminophen (NORCO) 5-325 MG tablet; Take 1 tablet by mouth every 6 (six) hours as needed for moderate pain.  Viral illness  Chest congestion -  pseudoephedrine-guaifenesin (MUCINEX D) 60-600 MG 12 hr tablet; Take 1 tablet by mouth every 12 (twelve) hours for 5 days.    Patient Instructions       If you have lab work done today you will be contacted with your lab results within the next 2 weeks.  If you have not heard from [place] then please contact us. The fastest way to get your results is to register for My Chart.   IF you received an x-ray today, you will receive an invoice from [facility]. Please contact [facility] at [phone] with questions or concerns regarding your invoice.   IF you received labwork today, you will receive an invoice from [facility]. Please contact [facility] at [phone] with questions or concerns regarding your invoice.   Our billing staff will not be able to assist you with questions regarding bills from these companies.  You will be contacted with the lab results as soon as they are available. The fastest way to get your results is to activate your My Chart account. Instructions are located on the last page of this paperwork. If you have not heard from [place] regarding the results in 2 weeks, please contact this office.    We recommend that you schedule a mammogram for breast cancer screening. Typically, you do not need a referral to do this. Please contact a local imaging center to schedule your mammogram.  [facility] - [phone]  *ask for the Radiology Department [facility] ([place]) - [phone] or [phone]  [facility] - [phone] [place] [phone] [facility] - [phone]  *ask for the [facility] - [phone]  *ask for the Radiology Department [facility] - [phone]  *ask for the [place] - [phone] Viral Respiratory Infection A viral respiratory infection is an illness that affects parts of the body that are used for breathing. These include the lungs, nose, and throat. It is caused by a germ called a virus. Some examples of this kind of infection are:  A cold.  The flu (influenza).  A respiratory syncytial virus (RSV) infection. A person who gets this illness may have the following symptoms:  A stuffy or runny nose.  Yellow or green fluid in the nose.  A cough.  Sneezing.  Tiredness (fatigue).  Achy muscles.  A sore throat.  Sweating or chills.  A fever.  A headache. Follow these instructions at home: Managing pain and congestion  Take over-the-counter and prescription medicines only as told by your doctor.  If you have a sore throat, gargle with salt water. Do this 3-4 times per day or as needed. To make a salt-water mixture, dissolve -1 tsp of salt in 1 cup of warm water. Make sure that all the salt dissolves.  Use nose drops made from salt water. This helps with stuffiness (congestion). It also helps soften the skin around your nose.  Drink enough fluid to keep your pee (urine) pale yellow. General instructions   Rest as much as possible.  Do not drink alcohol.  Do not use any products that have nicotine or tobacco, such as cigarettes and e-cigarettes. If you need help quitting, ask your doctor.  Keep all follow-up visits as told by your doctor. This is important. How is this prevented?   Get a flu shot every year. Ask your doctor when you should get your flu shot.  Do not  let other people get your germs. If you are sick: ? Stay home from  work or school. ? Wash your hands with soap and water often. Wash your hands after you cough or sneeze. If soap and water are not available, use hand sanitizer.  Avoid contact with people who are sick during cold and flu season. This is in fall and winter. Get help if:  Your symptoms last for 10 days or longer.  Your symptoms get worse over time.  You have a fever.  You have very bad pain in your face or forehead.  Parts of your jaw or neck become very swollen. Get help right away if:  You feel pain or pressure in your chest.  You have shortness of breath.  You faint or feel like you will faint.  You keep throwing up (vomiting).  You feel confused. Summary  A viral respiratory infection is an illness that affects parts of the body that are used for breathing.  Examples of this illness include a cold, the flu, and respiratory syncytial virus (RSV) infection.  The infection can cause a runny nose, cough, sneezing, sore throat, and fever.  Follow what your doctor tells you about taking medicines, drinking lots of fluid, washing your hands, resting at home, and avoiding people who are sick. This information is not intended to replace advice given to you by your health care provider. Make sure you discuss any questions you have with your health care provider. Document Released: 02/23/2008 Document Revised: 04/22/2017 Document Reviewed: 04/22/2017 Elsevier Interactive Patient Education  2019 Elsevier Inc.      Agustina Caroli, MD [facility]

## 2018-03-15 ENCOUNTER — Encounter: Payer: Self-pay | Admitting: Radiology

## 2018-03-15 LAB — COMPREHENSIVE METABOLIC PANEL
A/G RATIO: 1.7 (ref 1.2–2.2)
ALK PHOS: 123 IU/L — AB (ref 39–117)
ALT: 42 IU/L — AB (ref 0–32)
AST: 31 IU/L (ref 0–40)
Albumin: 4.4 g/dL (ref 3.5–5.5)
BUN/Creatinine Ratio: 23 (ref 9–23)
BUN: 14 mg/dL (ref 6–24)
Bilirubin Total: 0.4 mg/dL (ref 0.0–1.2)
CALCIUM: 9.4 mg/dL (ref 8.7–10.2)
CO2: 23 mmol/L (ref 20–29)
CREATININE: 0.62 mg/dL (ref 0.57–1.00)
Chloride: 105 mmol/L (ref 96–106)
GFR calc Af Amer: 117 mL/min/{1.73_m2} (ref >59)
GFR calc non Af Amer: 102 mL/min/{1.73_m2} (ref >59)
GLOBULIN, TOTAL: 2.6 g/dL (ref 1.5–4.5)
Glucose: 91 mg/dL (ref 65–99)
POTASSIUM: 4 mmol/L (ref 3.5–5.2)
SODIUM: 142 mmol/L (ref 134–144)
Total Protein: 7 g/dL (ref 6.0–8.5)

## 2018-03-15 LAB — HEMOGLOBIN A1C
Est. average glucose Bld gHb Est-mCnc: 103 mg/dL
HEMOGLOBIN A1C: 5.2 % (ref 4.8–5.6)

## 2018-03-15 LAB — THYROID PANEL WITH TSH
FREE THYROXINE INDEX: 1.6 (ref 1.2–4.9)
T3 Uptake Ratio: 22 % — ABNORMAL LOW (ref 24–39)
T4, Total: 7.3 ug/dL (ref 4.5–12.0)
TSH: 6.05 u[IU]/mL — AB (ref 0.450–4.500)

## 2018-03-18 ENCOUNTER — Encounter: Payer: Self-pay | Admitting: Emergency Medicine

## 2018-03-24 ENCOUNTER — Other Ambulatory Visit: Payer: Self-pay | Admitting: Emergency Medicine

## 2018-03-24 DIAGNOSIS — E039 Hypothyroidism, unspecified: Secondary | ICD-10-CM

## 2018-03-24 MED ORDER — LEVOTHYROXINE SODIUM 50 MCG PO TABS: 50.0000 ug | ORAL_TABLET | Freq: Every day | ORAL | 3 refills | Status: DC

## 2018-05-07 ENCOUNTER — Encounter: Payer: Self-pay | Admitting: Dietician

## 2018-05-07 ENCOUNTER — Encounter: Payer: BC Managed Care – PPO | Attending: Emergency Medicine | Admitting: Dietician

## 2018-05-07 VITALS — Wt 176.4 lb

## 2018-05-07 DIAGNOSIS — E669 Obesity, unspecified: Secondary | ICD-10-CM | POA: Diagnosis present

## 2018-05-07 NOTE — Patient Instructions (Signed)
   Use the Meal Ideas, Breakfast Ideas, and Snack Ideas sheets to create balanced meals.   Remember to include lots of vegetables, fruits, and whole grains for fiber!   Keep moving! The goal is 30 minutes of activity on 5 days/week. Aiming to walk 8,000 steps/day is a great goal. Using the treadmill is a good idea too!  Use your measuring cups to measure out your servings of food to get an idea for what servings look like.   Drink plenty of water throughout the day. The goal is to drink 64 ounces of fluid per day, with at least half of that (32+ ounces) being water.

## 2018-05-07 NOTE — Progress Notes (Signed)
Medical Nutrition Therapy  Appt Start Time: 4:10pm End Time: 56pm  Primary concerns today: weight management   Preferred learning style: no preference indicated Learning readiness: ready   NUTRITION ASSESSMENT   Anthropometrics  Weight: 56lbs   Clinical Medical Hx: obesity, vitamin D deficiency, hypothyroidism, anxiety  Surgeries: N/A Allergies: N/A Medications: levothyroxine  Psychosocial/Lifestyle Pt states she is a Pharmacist, hospital. Pt is from [place]. Pt lost her 56 year-old dog recently. Pt lives with her husband and she has 2 children.   24-Hr Dietary Recall First Meal: coffee + fried egg (or crackers, or biscuit)  Snack: peanuts  Second Meal: chickpeas + chicken (or fajitas with beef + rice)  Snack: coffee Third Meal: beef (or chicken soup) + salad  Snack: none stated Beverages: coffee + wine + water + Coke Zero   Food & Nutrition Related Hx Dietary Hx: Pt states she and her daughter have tried the keto diet (she has not as strictly as her daughter) but have cut out fruits and many carbohydrate foods. However, pt states she has started incorporating these foods back in, especially rice and beans. Pt states she likes yucca root, plantains, green beans, squash, fish, shrimp, chicken, and beef. Pt states she does not like eggplant, but otherwise generally likes all foods. Pt states she does not drink much fluid and wants to work on drinking more water. Pt states she would prefer to continue following a lower-carbohydrate diet. Pt recently lost their family dog which was hard on her, and she started eating more "comfort food" and put on more weight. Pt states she likes to drink wine and tequila when she and her husband have friends over, and asked if these drinks were "okay." Pt asked about several other foods, including a powdered milk product from her home country and plans to use this in her coffee.  Estimated Daily Fluid Intake: 1-2 water bottles  Supplements: none stated  GI  / Other Notable Symptoms: constipation (pt states this has occurred since incorporating more bread back into her diet over the past couple of weeks)   Physical Activity  Current average weekly physical activity: 8,000 steps/day   Estimated Energy Needs Calories: 1800 Carbohydrate: 200g Protein: 135g Fat: 50g   NUTRITION DIAGNOSIS  Overweight/obesity ([place]-3.3) related to increased psychological/life stress as evidenced by conditions associated with diagnoses and treatments (hypothyroidism and anxiety and associated medications.)    NUTRITION INTERVENTION  Nutrition education (E-1) on the following topics:  . General healthful eating: MyPlate, food groups, eating a variety of foods, portion control, limiting added sugars . Energy: macronutrients (including alcohol), role/importance of each, balance, sources of each  . Fiber: sources, importance, role in helping with satiety and portion control  . Mindful eating: chewing thoroughly, taking one bite at a time, recognizing hunger/fullness, cravings  Handouts Provided Include   MyPlate   Meal Ideas   Breakfast Ideas   Snack Ideas   [facility] Serving Sizes (yellow card)   How to Read a Nutrition Facts Label  Learning Style & Readiness for Change Teaching method utilized: Visual & Auditory  Demonstrated degree of understanding via: Teach Back  Barriers to learning/adherence to lifestyle change: None Identified   MONITORING & EVALUATION Dietary intake, weekly physical activity, and weight in 3 months.  Patient's Goals:   Create balanced meals incorporating vegetables, complex carbs, and lean protein.   Include lots of vegetables, fruits, and whole grains for fiber.   Aim to walk 8,000 steps/day. Work towards 150 minutes of activity each  week (about 30 minutes/day on 5 days/week.) Plan to use the treadmill at home as well.   Use measuring cups to measure out servings of food to get an idea for what servings look like.   Drink  64 ounces of fluid per day, with at least half being plain water.   RD's Notes for Next Visit  . Guidance with structured meal planning (per pt request)  . More meal and snack ideas   Next Steps  Patient is to return to [facility] for follow up appointment in 3 months.

## 2018-08-06 ENCOUNTER — Ambulatory Visit: Payer: BC Managed Care – PPO | Admitting: Dietician

## 2019-02-18 ENCOUNTER — Ambulatory Visit: Payer: BC Managed Care – PPO | Admitting: Emergency Medicine

## 2019-02-18 ENCOUNTER — Other Ambulatory Visit: Payer: Self-pay

## 2019-02-18 ENCOUNTER — Ambulatory Visit (INDEPENDENT_AMBULATORY_CARE_PROVIDER_SITE_OTHER): Payer: BC Managed Care – PPO

## 2019-02-18 ENCOUNTER — Encounter: Payer: Self-pay | Admitting: Emergency Medicine

## 2019-02-18 VITALS — BP 128/81 | HR 79 | Temp 99.2°F | Ht 61.18 in | Wt 179.6 lb

## 2019-02-18 DIAGNOSIS — Z7722 Contact with and (suspected) exposure to environmental tobacco smoke (acute) (chronic): Secondary | ICD-10-CM | POA: Diagnosis not present

## 2019-02-18 DIAGNOSIS — Z1329 Encounter for screening for other suspected endocrine disorder: Secondary | ICD-10-CM

## 2019-02-18 DIAGNOSIS — Z0001 Encounter for general adult medical examination with abnormal findings: Secondary | ICD-10-CM | POA: Diagnosis not present

## 2019-02-18 DIAGNOSIS — Z0000 Encounter for general adult medical examination without abnormal findings: Secondary | ICD-10-CM

## 2019-02-18 DIAGNOSIS — Z130 Encounter for screening for diseases of the blood and blood-forming organs and certain disorders involving the immune mechanism: Secondary | ICD-10-CM

## 2019-02-18 DIAGNOSIS — Z13220 Encounter for screening for lipoid disorders: Secondary | ICD-10-CM | POA: Diagnosis not present

## 2019-02-18 DIAGNOSIS — Z13228 Encounter for screening for other metabolic disorders: Secondary | ICD-10-CM

## 2019-02-18 DIAGNOSIS — E039 Hypothyroidism, unspecified: Secondary | ICD-10-CM

## 2019-02-18 NOTE — Progress Notes (Signed)
Linda Reese 58 y.o.   Chief Complaint  Patient presents with  . Labs Only    requesting lab work, wants to talk about xray, around 2nd hand smoking.     HISTORY OF PRESENT ILLNESS: This is a 58 y.o. female here for an annual exam.  Seen by me last December 2019. Has history of hypothyroidism, on Synthroid 50 mcg daily. Was recently in [place] due to father's illness and death. Concerned about secondhand smoking from husband.  Father had COPD.  Inquiring about chest x-ray. Also requesting blood work. No other complaints or medical concerns today.  HPI   Prior to Admission medications   Medication Sig Start Date End Date Taking? Authorizing Provider  levothyroxine (SYNTHROID, LEVOTHROID) 50 MCG tablet Take 1 tablet (50 mcg total) by mouth daily. 03/24/18  Yes Linda Bloomer, MD    No Known Allergies  Patient Active Problem List   Diagnosis Date Noted  . Thyroid activity decreased 06/10/2014  . Other specified hypothyroidism 04/27/2014  . Hemorrhoids 05/09/2012  . Family history of lupus erythematosus 05/09/2012  . DYSPEPSIA 07/06/2008  . B12 DEFICIENCY 11/24/2007    Past Medical History:  Diagnosis Date  . Allergy   . Anxiety   . Chronic headaches   . Hemorrhoids   . Hypothyroidism   . Vitamin D deficiency     Past Surgical History:  Procedure Laterality Date  . nexplanon  07/03/2010   insertion   . OVARY BIOPSY      Social History   Socioeconomic History  . Marital status: Married    Spouse name: Not on file  . Number of children: 2  . Years of education: Not on file  . Highest education level: Not on file  Occupational History  . Not on file  Social Needs  . Financial resource strain: Not on file  . Food insecurity    Worry: Not on file    Inability: Not on file  . Transportation needs    Medical: Not on file    Non-medical: Not on file  Tobacco Use  . Smoking status: Never Smoker  . Smokeless tobacco: Never Used  Substance and  Sexual Activity  . Alcohol use: Yes    Alcohol/week: 0.0 standard drinks    Comment: "sometimes" weekend wine and margarita  . Drug use: No  . Sexual activity: Yes    Birth control/protection: Inserts    Comment: nexplanon. 1st intercourse- 26, partners- 1  Lifestyle  . Physical activity    Days per week: Not on file    Minutes per session: Not on file  . Stress: Not on file  Relationships  . Social Herbalist on phone: Not on file    Gets together: Not on file    Attends religious service: Not on file    Active member of club or organization: Not on file    Attends meetings of clubs or organizations: Not on file    Relationship status: Not on file  . Intimate partner violence    Fear of current or ex partner: Not on file    Emotionally abused: Not on file    Physically abused: Not on file    Forced sexual activity: Not on file  Other Topics Concern  . Not on file  Social History Narrative  . Not on file    Family History  Problem Relation Age of Onset  . Diabetes Maternal Grandmother   . Cancer Maternal Grandmother  stomach  . Heart disease Maternal Grandfather   . Hyperlipidemia Mother   . Lupus Father   . Anemia Father   . COPD Father   . Hyperlipidemia Father   . Colon cancer Neg Hx      Review of Systems  Constitutional: Negative.  Negative for chills and fever.  HENT: Negative.  Negative for congestion and sore throat.   Respiratory: Negative.  Negative for cough and shortness of breath.   Cardiovascular: Negative.  Negative for chest pain and leg swelling.  Gastrointestinal: Negative.  Negative for abdominal pain, blood in stool, nausea and vomiting.  Genitourinary: Negative.  Negative for dysuria.  Musculoskeletal: Negative.  Negative for back pain, myalgias and neck pain.  Skin: Negative.  Negative for rash.  Neurological: Negative.  Negative for dizziness and headaches.  All other systems reviewed and are negative.    Today's  Vitals   02/18/19 0827  BP: 128/81  Pulse: 79  Temp: 99.2 F (37.3 C)  SpO2: 96%  Weight: 179 lb 9.6 oz (81.5 kg)  Height: 5' 1.18" (1.554 m)   Body mass index is 33.74 kg/m.   Physical Exam Vitals signs reviewed.  Constitutional:      Appearance: Normal appearance.  HENT:     Head: Normocephalic.  Eyes:     Extraocular Movements: Extraocular movements intact.     Conjunctiva/sclera: Conjunctivae normal.     Pupils: Pupils are equal, round, and reactive to light.  Neck:     Musculoskeletal: Normal range of motion and neck supple. No muscular tenderness.  Cardiovascular:     Rate and Rhythm: Normal rate and regular rhythm.     Pulses: Normal pulses.     Heart sounds: Normal heart sounds.  Pulmonary:     Effort: Pulmonary effort is normal.     Breath sounds: Normal breath sounds.  Abdominal:     Palpations: Abdomen is soft.     Tenderness: There is no abdominal tenderness.  Musculoskeletal: Normal range of motion.  Skin:    General: Skin is warm and dry.     Capillary Refill: Capillary refill takes less than 2 seconds.  Neurological:     General: No focal deficit present.     Mental Status: She is alert and oriented to person, place, and time.  Psychiatric:        Mood and Affect: Mood normal.        Behavior: Behavior normal.    Dg Chest 2 View  Result Date: 02/18/2019 CLINICAL DATA:  Intermittent wheezing.  Secondhand smoke exposure. EXAM: CHEST - 2 VIEW COMPARISON:  None. FINDINGS: Normal cardiac silhouette and mediastinal contours given slightly reduced lung volumes. There is minimal diffuse slightly nodular thickening of the pulmonary interstitium. Minimal bilateral infrahilar opacities favored to represent atelectasis. No discrete focal airspace opacities. No pleural effusion or pneumothorax. No evidence of edema. No acute osseous abnormalities. 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 IMPRESSION: Findings suggestive of airways disease / bronchitis. No  focal airspace opacities to suggest pneumonia. Electronically Signed   By: Sandi Mariscal M.D.   On: 02/18/2019 09:16     ASSESSMENT & PLAN: Linda Reese was seen today for labs only.  Diagnoses and all orders for this visit:  Routine general medical examination at a health care facility  Hypothyroidism, unspecified type -     Thyroid Panel With TSH  Screening for deficiency anemia  Screening for lipoid disorders -     CBC  Screening for endocrine, metabolic and immunity  disorder -     Lipid panel -     Vitamin B12 -     Vitamin D 25 (osteoporosis screening)  Secondhand smoke exposure -     Hemoglobin A1c -     Comprehensive metabolic panel -     DG Chest 2 View; Future    Patient Instructions       If you have lab work done today you will be contacted with your lab results within the next 2 weeks.  If you have not heard from [place] then please contact us. The fastest way to get your results is to register for My Chart.   IF you received an x-ray today, you will receive an invoice from [facility]. Please contact [facility] at [phone] with questions or concerns regarding your invoice.   IF you received labwork today, you will receive an invoice from [facility]. Please contact [facility] at [phone] with questions or concerns regarding your invoice.   Our billing staff will not be able to assist you with questions regarding bills from these companies.  You will be contacted with the lab results as soon as they are available. The fastest way to get your results is to activate your My Chart account. Instructions are located on the last page of this paperwork. If you have not heard from [place] regarding the results in 2 weeks, please contact this office.      Health Maintenance, Female Adopting a healthy lifestyle and getting preventive care are important in promoting health and wellness. Ask your health care provider about:  The right schedule for you to have  regular tests and exams.  Things you can do on your own to prevent diseases and keep yourself healthy. What should I know about diet, weight, and exercise? Eat a healthy diet   Eat a diet that includes plenty of vegetables, fruits, low-fat dairy products, and lean protein.  Do not eat a lot of foods that are high in solid fats, added sugars, or sodium. Maintain a healthy weight Body mass index (BMI) is used to identify weight problems. It estimates body fat based on height and weight. Your health care provider can help determine your BMI and help you achieve or maintain a healthy weight. Get regular exercise Get regular exercise. This is one of the most important things you can do for your health. Most adults should:  Exercise for at least 150 minutes each week. The exercise should increase your heart rate and make you sweat (moderate-intensity exercise).  Do strengthening exercises at least twice a week. This is in addition to the moderate-intensity exercise.  Spend less time sitting. Even light physical activity can be beneficial. Watch cholesterol and blood lipids Have your blood tested for lipids and cholesterol at 56 years of age, then have this test every 5 years. Have your cholesterol levels checked more often if:  Your lipid or cholesterol levels are high.  You are older than 56 years of age.  You are at high risk for heart disease. What should I know about cancer screening? Depending on your health history and family history, you may need to have cancer screening at various ages. This may include screening for:  Breast cancer.  Cervical cancer.  Colorectal cancer.  Skin cancer.  Lung cancer. What should I know about heart disease, diabetes, and high blood pressure? Blood pressure and heart disease  High blood pressure causes heart disease and increases the risk of stroke. This is more likely to develop  in people who have high blood pressure readings, are of  African descent, or are overweight.  Have your blood pressure checked: ? Every 3-5 years if you are 105-32 years of age. ? Every year if you are 33 years old or older. Diabetes Have regular diabetes screenings. This checks your fasting blood sugar level. Have the screening done:  Once every three years after age 73 if you are at a normal weight and have a low risk for diabetes.  More often and at a younger age if you are overweight or have a high risk for diabetes. What should I know about preventing infection? Hepatitis B If you have a higher risk for hepatitis B, you should be screened for this virus. Talk with your health care provider to find out if you are at risk for hepatitis B infection. Hepatitis C Testing is recommended for:  Everyone born from [birth year] through 1963.  Anyone with known risk factors for hepatitis C. Sexually transmitted infections (STIs)  Get screened for STIs, including gonorrhea and chlamydia, if: ? You are sexually active and are younger than 56 years of age. ? You are older than 56 years of age and your health care provider tells you that you are at risk for this type of infection. ? Your sexual activity has changed since you were last screened, and you are at increased risk for chlamydia or gonorrhea. Ask your health care provider if you are at risk.  Ask your health care provider about whether you are at high risk for HIV. Your health care provider may recommend a prescription medicine to help prevent HIV infection. If you choose to take medicine to prevent HIV, you should first get tested for HIV. You should then be tested every 3 months for as long as you are taking the medicine. Pregnancy  If you are about to stop having your period (premenopausal) and you may become pregnant, seek counseling before you get pregnant.  Take 400 to 800 micrograms (mcg) of folic acid every day if you become pregnant.  Ask for birth control (contraception) if you want to  prevent pregnancy. Osteoporosis and menopause Osteoporosis is a disease in which the bones lose minerals and strength with aging. This can result in bone fractures. If you are 15 years old or older, or if you are at risk for osteoporosis and fractures, ask your health care provider if you should:  Be screened for bone loss.  Take a calcium or vitamin D supplement to lower your risk of fractures.  Be given hormone replacement therapy (HRT) to treat symptoms of menopause. Follow these instructions at home: Lifestyle  Do not use any products that contain nicotine or tobacco, such as cigarettes, e-cigarettes, and chewing tobacco. If you need help quitting, ask your health care provider.  Do not use street drugs.  Do not share needles.  Ask your health care provider for help if you need support or information about quitting drugs. Alcohol use  Do not drink alcohol if: ? Your health care provider tells you not to drink. ? You are pregnant, may be pregnant, or are planning to become pregnant.  If you drink alcohol: ? Limit how much you use to 0-1 drink a day. ? Limit intake if you are breastfeeding.  Be aware of how much alcohol is in your drink. In the [place] one drink equals one 12 oz bottle of beer (355 mL), one 5 oz glass of wine (148 mL), or one 1 oz glass of  hard liquor (44 mL). General instructions  Schedule regular health, dental, and eye exams.  Stay current with your vaccines.  Tell your health care provider if: ? You often feel depressed. ? You have ever been abused or do not feel safe at home. Summary  Adopting a healthy lifestyle and getting preventive care are important in promoting health and wellness.  Follow your health care provider's instructions about healthy diet, exercising, and getting tested or screened for diseases.  Follow your health care provider's instructions on monitoring your cholesterol and blood pressure. This information is not intended to  replace advice given to you by your health care provider. Make sure you discuss any questions you have with your health care provider. Document Released: 09/25/2010 Document Revised: 03/05/2018 Document Reviewed: 03/05/2018 Elsevier Patient Education  2020 Elsevier Inc.      Agustina Caroli, MD [facility]

## 2019-02-18 NOTE — Patient Instructions (Addendum)
   If you have lab work done today you will be contacted with your lab results within the next 2 weeks.  If you have not heard from us then please contact us. The fastest way to get your results is to register for My Chart.   IF you received an x-ray today, you will receive an invoice from [facility]. Please contact [facility] at [phone] with questions or concerns regarding your invoice.   IF you received labwork today, you will receive an invoice from [facility]. Please contact [facility] at [phone] with questions or concerns regarding your invoice.   Our billing staff will not be able to assist you with questions regarding bills from these companies.  You will be contacted with the lab results as soon as they are available. The fastest way to get your results is to activate your My Chart account. Instructions are located on the last page of this paperwork. If you have not heard from us regarding the results in 2 weeks, please contact this office.      Health Maintenance, Female Adopting a healthy lifestyle and getting preventive care are important in promoting health and wellness. Ask your health care provider about:  The right schedule for you to have regular tests and exams.  Things you can do on your own to prevent diseases and keep yourself healthy. What should I know about diet, weight, and exercise? Eat a healthy diet   Eat a diet that includes plenty of vegetables, fruits, low-fat dairy products, and lean protein.  Do not eat a lot of foods that are high in solid fats, added sugars, or sodium. Maintain a healthy weight Body mass index (BMI) is used to identify weight problems. It estimates body fat based on height and weight. Your health care provider can help determine your BMI and help you achieve or maintain a healthy weight. Get regular exercise Get regular exercise. This is one of the most important things you can do for your health. Most  adults should:  Exercise for at least 150 minutes each week. The exercise should increase your heart rate and make you sweat (moderate-intensity exercise).  Do strengthening exercises at least twice a week. This is in addition to the moderate-intensity exercise.  Spend less time sitting. Even light physical activity can be beneficial. Watch cholesterol and blood lipids Have your blood tested for lipids and cholesterol at 56 years of age, then have this test every 5 years. Have your cholesterol levels checked more often if:  Your lipid or cholesterol levels are high.  You are older than 56 years of age.  You are at high risk for heart disease. What should I know about cancer screening? Depending on your health history and family history, you may need to have cancer screening at various ages. This may include screening for:  Breast cancer.  Cervical cancer.  Colorectal cancer.  Skin cancer.  Lung cancer. What should I know about heart disease, diabetes, and high blood pressure? Blood pressure and heart disease  High blood pressure causes heart disease and increases the risk of stroke. This is more likely to develop in people who have high blood pressure readings, are of African descent, or are overweight.  Have your blood pressure checked: ? Every 3-5 years if you are 18-39 years of age. ? Every year if you are 40 years old or older. Diabetes Have regular diabetes screenings. This checks your fasting blood sugar level. Have the screening done:  Once every   three years after age 40 if you are at a normal weight and have a low risk for diabetes.  More often and at a younger age if you are overweight or have a high risk for diabetes. What should I know about preventing infection? Hepatitis B If you have a higher risk for hepatitis B, you should be screened for this virus. Talk with your health care provider to find out if you are at risk for hepatitis B infection. Hepatitis  C Testing is recommended for:  Everyone born from 1945 through 1965.  Anyone with known risk factors for hepatitis C. Sexually transmitted infections (STIs)  Get screened for STIs, including gonorrhea and chlamydia, if: ? You are sexually active and are younger than 56 years of age. ? You are older than 56 years of age and your health care provider tells you that you are at risk for this type of infection. ? Your sexual activity has changed since you were last screened, and you are at increased risk for chlamydia or gonorrhea. Ask your health care provider if you are at risk.  Ask your health care provider about whether you are at high risk for HIV. Your health care provider may recommend a prescription medicine to help prevent HIV infection. If you choose to take medicine to prevent HIV, you should first get tested for HIV. You should then be tested every 3 months for as long as you are taking the medicine. Pregnancy  If you are about to stop having your period (premenopausal) and you may become pregnant, seek counseling before you get pregnant.  Take 400 to 800 micrograms (mcg) of folic acid every day if you become pregnant.  Ask for birth control (contraception) if you want to prevent pregnancy. Osteoporosis and menopause Osteoporosis is a disease in which the bones lose minerals and strength with aging. This can result in bone fractures. If you are 65 years old or older, or if you are at risk for osteoporosis and fractures, ask your health care provider if you should:  Be screened for bone loss.  Take a calcium or vitamin D supplement to lower your risk of fractures.  Be given hormone replacement therapy (HRT) to treat symptoms of menopause. Follow these instructions at home: Lifestyle  Do not use any products that contain nicotine or tobacco, such as cigarettes, e-cigarettes, and chewing tobacco. If you need help quitting, ask your health care provider.  Do not use street  drugs.  Do not share needles.  Ask your health care provider for help if you need support or information about quitting drugs. Alcohol use  Do not drink alcohol if: ? Your health care provider tells you not to drink. ? You are pregnant, may be pregnant, or are planning to become pregnant.  If you drink alcohol: ? Limit how much you use to 0-1 drink a day. ? Limit intake if you are breastfeeding.  Be aware of how much alcohol is in your drink. In the [place] one drink equals one 12 oz bottle of beer (355 mL), one 5 oz glass of wine (148 mL), or one 1 oz glass of hard liquor (44 mL). General instructions  Schedule regular health, dental, and eye exams.  Stay current with your vaccines.  Tell your health care provider if: ? You often feel depressed. ? You have ever been abused or do not feel safe at home. Summary  Adopting a healthy lifestyle and getting preventive care are important in promoting health and wellness.    Follow your health care provider's instructions about healthy diet, exercising, and getting tested or screened for diseases.  Follow your health care provider's instructions on monitoring your cholesterol and blood pressure. This information is not intended to replace advice given to you by your health care provider. Make sure you discuss any questions you have with your health care provider. Document Released: 09/25/2010 Document Revised: 03/05/2018 Document Reviewed: 03/05/2018 Elsevier Patient Education  2020 Elsevier Inc.  

## 2019-02-19 LAB — CBC
Hematocrit: 40.5 % (ref 34.0–46.6)
Hemoglobin: 13.7 g/dL (ref 11.1–15.9)
MCH: 31.1 pg (ref 26.6–33.0)
MCHC: 33.8 g/dL (ref 31.5–35.7)
MCV: 92 fL (ref 79–97)
Platelets: 242 10*3/uL (ref 150–450)
RBC: 4.41 x10E6/uL (ref 3.77–5.28)
RDW: 12.7 % (ref 11.7–15.4)
WBC: 5.2 10*3/uL (ref 3.4–10.8)

## 2019-02-19 LAB — LIPID PANEL
Chol/HDL Ratio: 4 ratio (ref 0.0–4.4)
Cholesterol, Total: 167 mg/dL (ref 100–199)
HDL: 42 mg/dL (ref >39)
LDL Chol Calc (NIH): 103 mg/dL — ABNORMAL HIGH (ref 0–99)
Triglycerides: 121 mg/dL (ref 0–149)
VLDL Cholesterol Cal: 22 mg/dL (ref 5–40)

## 2019-02-19 LAB — COMPREHENSIVE METABOLIC PANEL
ALT: 20 IU/L (ref 0–32)
AST: 21 IU/L (ref 0–40)
Albumin/Globulin Ratio: 1.6 (ref 1.2–2.2)
Albumin: 4.3 g/dL (ref 3.8–4.9)
Alkaline Phosphatase: 121 IU/L — ABNORMAL HIGH (ref 39–117)
BUN/Creatinine Ratio: 16 (ref 9–23)
BUN: 11 mg/dL (ref 6–24)
Bilirubin Total: 0.6 mg/dL (ref 0.0–1.2)
CO2: 21 mmol/L (ref 20–29)
Calcium: 8.7 mg/dL (ref 8.7–10.2)
Chloride: 105 mmol/L (ref 96–106)
Creatinine, Ser: 0.7 mg/dL (ref 0.57–1.00)
GFR calc Af Amer: 113 mL/min/{1.73_m2} (ref >59)
GFR calc non Af Amer: 98 mL/min/{1.73_m2} (ref >59)
Globulin, Total: 2.7 g/dL (ref 1.5–4.5)
Glucose: 106 mg/dL — ABNORMAL HIGH (ref 65–99)
Potassium: 3.9 mmol/L (ref 3.5–5.2)
Sodium: 142 mmol/L (ref 134–144)
Total Protein: 7 g/dL (ref 6.0–8.5)

## 2019-02-19 LAB — THYROID PANEL WITH TSH
Free Thyroxine Index: 1.9 (ref 1.2–4.9)
T3 Uptake Ratio: 21 % — ABNORMAL LOW (ref 24–39)
T4, Total: 9.1 ug/dL (ref 4.5–12.0)
TSH: 5.28 u[IU]/mL — ABNORMAL HIGH (ref 0.450–4.500)

## 2019-02-19 LAB — HEMOGLOBIN A1C
Est. average glucose Bld gHb Est-mCnc: 105 mg/dL
Hgb A1c MFr Bld: 5.3 % (ref 4.8–5.6)

## 2019-02-22 ENCOUNTER — Encounter: Payer: Self-pay | Admitting: Emergency Medicine

## 2019-04-10 ENCOUNTER — Encounter: Payer: Self-pay | Admitting: Emergency Medicine

## 2019-04-13 ENCOUNTER — Other Ambulatory Visit: Payer: Self-pay | Admitting: Emergency Medicine

## 2019-04-13 DIAGNOSIS — E039 Hypothyroidism, unspecified: Secondary | ICD-10-CM

## 2019-04-13 MED ORDER — LEVOTHYROXINE SODIUM 75 MCG PO TABS: 75.0000 ug | ORAL_TABLET | Freq: Every day | ORAL | 3 refills | Status: DC

## 2019-09-17 LAB — HM MAMMOGRAPHY

## 2019-09-22 ENCOUNTER — Encounter: Payer: Self-pay | Admitting: *Deleted

## 2020-01-25 ENCOUNTER — Encounter (HOSPITAL_COMMUNITY): Payer: Self-pay

## 2020-01-25 ENCOUNTER — Other Ambulatory Visit: Payer: Self-pay

## 2020-01-25 ENCOUNTER — Emergency Department (HOSPITAL_COMMUNITY)
Admission: EM | Admit: 2020-01-25 | Discharge: 2020-01-26 | Disposition: A | Discharge status: Home / Self Care | Payer: BC Managed Care – PPO | Attending: Emergency Medicine | Admitting: Emergency Medicine

## 2020-01-25 DIAGNOSIS — R109 Unspecified abdominal pain: Secondary | ICD-10-CM

## 2020-01-25 DIAGNOSIS — E039 Hypothyroidism, unspecified: Secondary | ICD-10-CM | POA: Insufficient documentation

## 2020-01-25 DIAGNOSIS — R1011 Right upper quadrant pain: Secondary | ICD-10-CM | POA: Insufficient documentation

## 2020-01-25 DIAGNOSIS — R1013 Epigastric pain: Secondary | ICD-10-CM | POA: Diagnosis present

## 2020-01-25 LAB — CBC
HCT: 38.6 % (ref 36.0–46.0)
Hemoglobin: 12.9 g/dL (ref 12.0–15.0)
MCH: 31.4 pg (ref 26.0–34.0)
MCHC: 33.4 g/dL (ref 30.0–36.0)
MCV: 93.9 fL (ref 80.0–100.0)
Platelets: 231 10*3/uL (ref 150–400)
RBC: 4.11 MIL/uL (ref 3.87–5.11)
RDW: 12.8 % (ref 11.5–15.5)
WBC: 6.2 10*3/uL (ref 4.0–10.5)
nRBC: 0 % (ref 0.0–0.2)

## 2020-01-25 LAB — URINALYSIS, ROUTINE W REFLEX MICROSCOPIC
Bilirubin Urine: NEGATIVE
Glucose, UA: NEGATIVE mg/dL
Hgb urine dipstick: NEGATIVE
Ketones, ur: NEGATIVE mg/dL
Leukocytes,Ua: NEGATIVE
Nitrite: NEGATIVE
Protein, ur: NEGATIVE mg/dL
Specific Gravity, Urine: 1.01 (ref 1.005–1.030)
pH: 7 (ref 5.0–8.0)

## 2020-01-25 LAB — BASIC METABOLIC PANEL
Anion gap: 9 (ref 5–15)
BUN: 13 mg/dL (ref 6–20)
CO2: 24 mmol/L (ref 22–32)
Calcium: 8.9 mg/dL (ref 8.9–10.3)
Chloride: 107 mmol/L (ref 98–111)
Creatinine, Ser: 0.73 mg/dL (ref 0.44–1.00)
GFR, Estimated: >60 mL/min (ref >60)
Glucose, Bld: 112 mg/dL — ABNORMAL HIGH (ref 70–99)
Potassium: 3.7 mmol/L (ref 3.5–5.1)
Sodium: 140 mmol/L (ref 135–145)

## 2020-01-25 NOTE — ED Triage Notes (Signed)
t reports waking Saturday am with Right flank pain radiating to her RUQ area. Pt seen at [facility] and sent here for further evaluation

## 2020-01-26 ENCOUNTER — Emergency Department (HOSPITAL_COMMUNITY): Payer: BC Managed Care – PPO

## 2020-01-26 LAB — HEPATIC FUNCTION PANEL
ALT: 42 U/L (ref 0–44)
AST: 30 U/L (ref 15–41)
Albumin: 3.8 g/dL (ref 3.5–5.0)
Alkaline Phosphatase: 94 U/L (ref 38–126)
Bilirubin, Direct: 0.1 mg/dL (ref 0.0–0.2)
Indirect Bilirubin: 0.7 mg/dL (ref 0.3–0.9)
Total Bilirubin: 0.8 mg/dL (ref 0.3–1.2)
Total Protein: 7.4 g/dL (ref 6.5–8.1)

## 2020-01-26 LAB — LIPASE, BLOOD: Lipase: 25 U/L (ref 11–51)

## 2020-01-26 NOTE — ED Provider Notes (Addendum)
[facility] Provider Note   CSN: [id number] Arrival date & time: 01/25/20  1531     History Chief Complaint  Patient presents with  . Flank Pain    Linda Reese is a 59 y.o. female.   Flank Pain This is a new problem. The current episode started 2 days ago. The problem occurs constantly. The problem has not changed since onset.Associated symptoms include abdominal pain. Pertinent negatives include no chest pain, no headaches and no shortness of breath. Nothing aggravates the symptoms. Nothing relieves the symptoms. She has tried nothing for the symptoms. The treatment provided no relief.       Past Medical History:  Diagnosis Date  . Allergy   . Anxiety   . Chronic headaches   . Hemorrhoids   . Hypothyroidism   . Vitamin D deficiency     Patient Active Problem List   Diagnosis Date Noted  . Thyroid activity decreased 06/10/2014  . Other specified hypothyroidism 04/27/2014  . Hemorrhoids 05/09/2012  . Family history of lupus erythematosus 05/09/2012  . DYSPEPSIA 07/06/2008  . B12 DEFICIENCY 11/24/2007    Past Surgical History:  Procedure Laterality Date  . nexplanon  07/03/2010   insertion   . OVARY BIOPSY       OB History    Gravida  2   Para  2   Term  2   Preterm      AB      Living  2     SAB      TAB      Ectopic      Multiple      Live Births  2           Family History  Problem Relation Age of Onset  . Diabetes Maternal Grandmother   . Cancer Maternal Grandmother        stomach  . Heart disease Maternal Grandfather   . Hyperlipidemia Mother   . Lupus Father   . Anemia Father   . COPD Father   . Hyperlipidemia Father   . Colon cancer Neg Hx     Social History   Tobacco Use  . Smoking status: Never Smoker  . Smokeless tobacco: Never Used  Vaping Use  . Vaping Use: Never used  Substance Use Topics  . Alcohol use: Yes    Alcohol/week: 0.0 standard drinks    Comment:  "sometimes" weekend wine and margarita  . Drug use: No    Home Medications Prior to Admission medications   Medication Sig Start Date End Date Taking? Authorizing Provider  levothyroxine (SYNTHROID) 75 MCG tablet Take 1 tablet (75 mcg total) by mouth daily. 04/13/19   Horald Pollen, MD    Allergies    Patient has no known allergies.  Review of Systems   Review of Systems  Constitutional: Negative for chills and fever.  HENT: Negative for congestion and rhinorrhea.   Respiratory: Negative for cough and shortness of breath.   Cardiovascular: Negative for chest pain and palpitations.  Gastrointestinal: Positive for abdominal pain and constipation. Negative for diarrhea, nausea and vomiting.  Genitourinary: Positive for flank pain. Negative for difficulty urinating and dysuria.  Musculoskeletal: Negative for arthralgias and back pain.  Skin: Negative for rash and wound.  Neurological: Negative for light-headedness and headaches.    Physical Exam Updated Vital Signs BP 114/73   Pulse 67   Temp 98.1 F (36.7 C) (Oral)   Resp 18   LMP 06/15/2015  SpO2 97%   Physical Exam Vitals and nursing note reviewed. Exam conducted with a chaperone present.  Constitutional:      General: She is not in acute distress.    Appearance: Normal appearance.  HENT:     Head: Normocephalic and atraumatic.     Nose: No rhinorrhea.  Eyes:     General:        Right eye: No discharge.        Left eye: No discharge.     Conjunctiva/sclera: Conjunctivae normal.  Cardiovascular:     Rate and Rhythm: Normal rate and regular rhythm.  Pulmonary:     Effort: Pulmonary effort is normal. No respiratory distress.     Breath sounds: No stridor.  Abdominal:     General: Abdomen is flat. There is no distension.     Palpations: Abdomen is soft.     Tenderness: There is abdominal tenderness in the epigastric area. There is no guarding or rebound. Negative signs include Murphy's sign, Rovsing's sign  and McBurney's sign.  Musculoskeletal:        General: No tenderness or signs of injury.  Skin:    General: Skin is warm and dry.  Neurological:     General: No focal deficit present.     Mental Status: She is alert. Mental status is at baseline.     Motor: No weakness.  Psychiatric:        Mood and Affect: Mood normal.        Behavior: Behavior normal.     ED Results / Procedures / Treatments   Labs (all labs ordered are listed, but only abnormal results are displayed) Labs Reviewed  BASIC METABOLIC PANEL - Abnormal; Notable for the following components:      Result Value   Glucose, Bld 112 (*)    All other components within normal limits  URINALYSIS, ROUTINE W REFLEX MICROSCOPIC - Abnormal; Notable for the following components:   Color, Urine STRAW (*)    All other components within normal limits  CBC  HEPATIC FUNCTION PANEL  LIPASE, BLOOD    EKG None  Radiology CT Renal Stone Study  Result Date: 01/26/2020 CLINICAL DATA:  Right flank pain radiating to the upper quadrant EXAM: CT ABDOMEN AND PELVIS WITHOUT CONTRAST TECHNIQUE: Multidetector CT imaging of the abdomen and pelvis was performed following the standard protocol without IV contrast. COMPARISON:  04/05/2014 FINDINGS: Lower chest: Minimal ground-glass density is possible at the right base, limited by motion. No reported respiratory symptoms. Hepatobiliary: No focal liver abnormality.No evidence of biliary obstruction or stone. Pancreas: Unremarkable. Spleen: Unremarkable. Adrenals/Urinary Tract: Negative adrenals. No hydronephrosis or stone. Unremarkable bladder. Stomach/Bowel:  No obstruction. No appendicitis. Vascular/Lymphatic: No acute vascular abnormality. No mass or adenopathy. Reproductive:No pathologic findings. Other: No ascites or pneumoperitoneum. Musculoskeletal: No acute abnormalities. Notable L4-5 left foraminal stenosis due to disc bulging and endplate spurring. IMPRESSION: No acute finding.  No  hydronephrosis or nephrolithiasis. Electronically Signed   By: Monte Fantasia M.D.   On: 01/26/2020 05:11   US Abdomen Limited RUQ (LIVER/GB)  Result Date: 01/26/2020 CLINICAL DATA:  Right upper quadrant abdominal pain EXAM: ULTRASOUND ABDOMEN LIMITED RIGHT UPPER QUADRANT COMPARISON:  None. FINDINGS: Gallbladder: No gallstones or wall thickening visualized. No sonographic Murphy sign noted by sonographer. Common bile duct: Diameter: 4 mm in proximal diameter Liver: No focal lesion identified. Within normal limits in parenchymal echogenicity. Portal vein is patent on color Doppler imaging with normal direction of blood flow towards the liver. Other:  No ascites IMPRESSION: Normal examination Electronically Signed   By: Fidela Salisbury MD   On: 01/26/2020 03:59    Procedures Ultrasound ED Abd  Date/Time: 01/26/2020 2:44 AM Performed by: Breck Coons, MD Authorized by: Breck Coons, MD   Procedure details:    Indications: abdominal pain     Assessment for:  AAA   Aorta:  Visualized       Vascular findings:    Aorta: aorta normal (< 3cm)     Aorta diameter:  1.1-1.5   Intra-abdominal fluid: unidentified     (including critical care time)  Medications Ordered in ED Medications - No data to display  ED Course  I have reviewed the triage vital signs and the nursing notes.  Pertinent labs & imaging results that were available during my care of the patient were reviewed by me and considered in my medical decision making (see chart for details).    MDM Rules/Calculators/A&P                         Right upper quadrant right flank pain.  No urinary symptoms.  Chronic constipation.  Poor diet lately.  Tolerating p.o.  Hemodynamically stable afebrile.  No peritoneal signs.  Lab studies done at triage showed no kidney dysfunction significant R derangements or leukocytosis or significant change in hemoglobin.  Bedside ultrasound to evaluate for abdominal aortic aneurysm was unremarkable as  described above.  Formal ultrasound for right upper quadrant will be ordered.  Ultrasound reviewed by radiology shows no acute pathology.  Further laboratory studies show no inflammatory markers in the hepatobiliary system patient is resting comfortably still has minimal discomfort.  I offered further evaluation to include renal ultrasound versus CT however she has no blood in the urine she has no renal dysfunction so I do not feel that it is emergent at this time.  Family and patient agree with this and feel comfortable discharge home  Nursing told me prior to the patient being discharged they change their mind and they would want the CT scan for nephrolithiasis or ureteral lithiasis.  CT without contrast is ordered.  If is negative they will be discharged home, possibly musculoskeletal pain.  CT imaging reviewed by radiology myself shows no acute abnormality, patient is safe for outpatient management, told to follow-up with primary care  Final Clinical Impression(s) / ED Diagnoses Final diagnoses:  RUQ pain  Undifferentiated abdominal pain    Rx / DC Orders ED Discharge Orders    None       Breck Coons, MD 01/26/20 0413    Breck Coons, MD 01/26/20 [phone]

## 2020-04-07 ENCOUNTER — Other Ambulatory Visit: Payer: Self-pay | Admitting: Emergency Medicine

## 2020-04-07 DIAGNOSIS — E039 Hypothyroidism, unspecified: Secondary | ICD-10-CM

## 2020-04-28 ENCOUNTER — Telehealth (INDEPENDENT_AMBULATORY_CARE_PROVIDER_SITE_OTHER): Payer: BC Managed Care – PPO | Admitting: Emergency Medicine

## 2020-04-28 ENCOUNTER — Encounter: Payer: Self-pay | Admitting: Emergency Medicine

## 2020-04-28 ENCOUNTER — Other Ambulatory Visit: Payer: Self-pay

## 2020-04-28 VITALS — Ht 61.0 in | Wt 167.0 lb

## 2020-04-28 DIAGNOSIS — U071 COVID-19: Secondary | ICD-10-CM | POA: Diagnosis not present

## 2020-04-28 DIAGNOSIS — U099 Post covid-19 condition, unspecified: Secondary | ICD-10-CM

## 2020-04-28 DIAGNOSIS — R6889 Other general symptoms and signs: Secondary | ICD-10-CM

## 2020-04-28 DIAGNOSIS — Z1211 Encounter for screening for malignant neoplasm of colon: Secondary | ICD-10-CM

## 2020-04-28 NOTE — Patient Instructions (Signed)
° ° ° °  If you have lab work done today you will be contacted with your lab results within the next 2 weeks.  If you have not heard from us then please contact us. The fastest way to get your results is to register for My Chart. ° ° °IF you received an x-ray today, you will receive an invoice from [facility]. Please contact [facility] at [phone] with questions or concerns regarding your invoice.  ° °IF you received labwork today, you will receive an invoice from [facility]. Please contact [facility] at [phone] with questions or concerns regarding your invoice.  ° °Our billing staff will not be able to assist you with questions regarding bills from these companies. ° °You will be contacted with the lab results as soon as they are available. The fastest way to get your results is to activate your My Chart account. Instructions are located on the last page of this paperwork. If you have not heard from us regarding the results in 2 weeks, please contact this office. °  ° ° ° °

## 2020-04-28 NOTE — Progress Notes (Signed)
Telemedicine Encounter- SOAP NOTE Established Patient Patient: Home  Provider: Office     This telephone encounter was conducted with the patient's (or proxy's) verbal consent via audio telecommunications: yes/no: Yes Patient was instructed to have this encounter in a suitably private space; and to only have persons present to whom they give permission to participate. In addition, patient identity was confirmed by use of name plus two identifiers (DOB and address).  I discussed the limitations, risks, security and privacy concerns of performing an evaluation and management service by telephone and the availability of in person appointments. I also discussed with the patient that there may be a patient responsible charge related to this service. The patient expressed understanding and agreed to proceed.  I spent a total of TIME; 0 MIN TO 60 MIN: 20 minutes talking with the patient or their proxy.  No chief complaint on file.   Subjective   Linda Reese is a 60 y.o. female established patient. Telephone visit today for follow-up of COVID infection.  Day 1 04/10/2020. Slowly recovering.  Went back to work on 04/23/2018.  Still has some residual nasal congestion and loss of smell and taste. Still has some fatigue.  No other residual symptomatology. No other complaints or medical concerns today.  HPI   Patient Active Problem List   Diagnosis Date Noted  . Thyroid activity decreased 06/10/2014  . Other specified hypothyroidism 04/27/2014  . Hemorrhoids 05/09/2012  . Family history of lupus erythematosus 05/09/2012  . DYSPEPSIA 07/06/2008  . B12 DEFICIENCY 11/24/2007    Past Medical History:  Diagnosis Date  . Allergy   . Anxiety   . Chronic headaches   . Hemorrhoids   . Hypothyroidism   . Vitamin D deficiency     Current Outpatient Medications  Medication Sig Dispense Refill  . Ibuprofen (ADVIL PO) Take by mouth as needed.    Marland Kitchen levothyroxine (SYNTHROID) 75 MCG tablet TAKE  1 TABLET(75 MCG) BY MOUTH DAILY 30 tablet 0   No current facility-administered medications for this visit.    No Known Allergies  Social History   Socioeconomic History  . Marital status: Married    Spouse name: Not on file  . Number of children: 2  . Years of education: Not on file  . Highest education level: Not on file  Occupational History  . Not on file  Tobacco Use  . Smoking status: Never Smoker  . Smokeless tobacco: Never Used  Vaping Use  . Vaping Use: Never used  Substance and Sexual Activity  . Alcohol use: Yes    Alcohol/week: 0.0 standard drinks    Comment: "sometimes" weekend wine and margarita  . Drug use: No  . Sexual activity: Yes    Birth control/protection: Inserts    Comment: nexplanon. 1st intercourse- 26, partners- 1  Other Topics Concern  . Not on file  Social History Narrative  . Not on file   Social Determinants of Health   Financial Resource Strain: Not on file  Food Insecurity: Not on file  Transportation Needs: Not on file  Physical Activity: Not on file  Stress: Not on file  Social Connections: Not on file  Intimate Partner Violence: Not on file    Review of Systems  Constitutional: Negative.  Negative for chills and fever.  HENT: Positive for congestion.        Loss of smell and taste  Respiratory: Negative.  Negative for cough and shortness of breath.   Cardiovascular: Negative.  Negative  for chest pain and palpitations.  Gastrointestinal: Negative.  Negative for abdominal pain, diarrhea, nausea and vomiting.       History of hemorrhoids  Genitourinary: Negative.   Musculoskeletal: Negative.  Negative for back pain, myalgias and neck pain.  Skin: Negative for rash.  Neurological: Negative.  Negative for dizziness and headaches. Loss of consciousness: .assesspl.  All other systems reviewed and are negative.  Alert and oriented x3 in no apparent respiratory distress. Objective   Vitals as reported by the patient: Today's  Vitals   04/28/20 1630  Weight: 167 lb (75.8 kg)  Height: 5\' 1"  (1.549 m)    There are no diagnoses linked to this encounter.    Clinically stable.  No red flag signs or symptoms. No Covid complications.  Recovering well.  Back to work. Covid advice given. Advised to contact the office if no better or worse during the next several days. [name] was seen today for sore throat and cough.  Diagnoses and all orders for this visit:  COVID-19 virus infection Comments: Recent and recovering well  Screening for colon cancer -     Ambulatory referral to Gastroenterology  Flu-like symptoms  Post-COVID syndrome    I discussed the assessment and treatment plan with the patient. The patient was provided an opportunity to ask questions and all were answered. The patient agreed with the plan and demonstrated an understanding of the instructions.   The patient was advised to call back or seek an in-person evaluation if the symptoms worsen or if the condition fails to improve as anticipated.  I provided 20 minutes of non-face-to-face time during this encounter.  Horald Pollen, MD  Primary Care at [facility]

## 2020-05-06 ENCOUNTER — Other Ambulatory Visit: Payer: Self-pay | Admitting: Emergency Medicine

## 2020-05-06 DIAGNOSIS — E039 Hypothyroidism, unspecified: Secondary | ICD-10-CM

## 2020-05-06 NOTE — Telephone Encounter (Signed)
Requested medications are due for refill today yes  Requested medications are on the active medication list yes  Last refill 1/13  Last visit 01/2019  Future visit scheduled no  Notes to clinic Has already had a curtesy refill and there is no upcoming appointment scheduled.

## 2020-05-11 ENCOUNTER — Encounter: Payer: Self-pay | Admitting: Internal Medicine

## 2020-06-08 ENCOUNTER — Other Ambulatory Visit: Payer: Self-pay | Admitting: Emergency Medicine

## 2020-06-08 DIAGNOSIS — E039 Hypothyroidism, unspecified: Secondary | ICD-10-CM

## 2020-06-08 NOTE — Telephone Encounter (Signed)
  Notes to clinic: requesting a 90 day supply    Requested Prescriptions  Pending Prescriptions Disp Refills   levothyroxine (SYNTHROID) 75 MCG tablet [Pharmacy Med Name: LEVOTHYROXINE 0.075MG  (75MCG) TABS] 90 tablet     Sig: TAKE 1 TABLET(75 MCG) BY MOUTH DAILY      Endocrinology:  Hypothyroid Agents Failed - 06/08/2020  8:48 AM      Failed - TSH needs to be rechecked within 3 months after an abnormal result. Refill until TSH is due.      Failed - TSH in normal range and within 360 days    TSH  Date Value Ref Range Status  02/18/2019 5.280 (H) 0.450 - 4.500 uIU/mL Final          Passed - Valid encounter within last 12 months    Recent Outpatient Visits           1 month ago COVID-19 virus infection   [facility] at [facility], Ines Bloomer, MD   1 year ago Routine general medical examination at a health care facility   [facility], Ines Bloomer, MD   2 years ago Viral respiratory infection   Primary Care at [facility], Ines Bloomer, MD   3 years ago Dizziness   [facility] at [facility], Garber, MD   6 years ago Abdominal pain, unspecified abdominal location   Primary Care at [facility], [place] L, DO

## 2020-06-08 NOTE — Telephone Encounter (Signed)
Requested medications are due for refill today.  yes  Requested medications are on the active medications list.  yes  Last refill. 05/06/2020  Future visit scheduled.   no  Notes to clinic.  Last seen in office 02/18/2019

## 2020-06-24 ENCOUNTER — Telehealth: Payer: Self-pay

## 2020-06-24 ENCOUNTER — Encounter: Payer: Self-pay | Admitting: Internal Medicine

## 2020-06-24 ENCOUNTER — Ambulatory Visit: Payer: BC Managed Care – PPO | Admitting: Internal Medicine

## 2020-06-24 VITALS — BP 120/70 | HR 80 | Ht 61.25 in | Wt 172.1 lb

## 2020-06-24 DIAGNOSIS — K602 Anal fissure, unspecified: Secondary | ICD-10-CM

## 2020-06-24 DIAGNOSIS — K5900 Constipation, unspecified: Secondary | ICD-10-CM | POA: Diagnosis not present

## 2020-06-24 DIAGNOSIS — S30810A Abrasion of lower back and pelvis, initial encounter: Secondary | ICD-10-CM

## 2020-06-24 MED ORDER — AMBULATORY NON FORMULARY MEDICATION: 1 refills | Status: DC

## 2020-06-24 NOTE — Telephone Encounter (Signed)
FAXED DOCUMENTS  Location where fax was sent: Dr. Lorie Apley office and [place]: Rx for Diltiazem Gel and ROI for 2014 colonoscopy and path reports Confirmations received: Yes Documents and confirmation held for future reference: Yes

## 2020-06-24 NOTE — Patient Instructions (Addendum)
It was a pleasure to meet you today. Based on our discussion, I am providing you with my recommendations below:  RECOMMENDATION(S):    Take 2 tablespoons of Metamucil dissolved in 14 ounces of water or juice every day  Please purchase Desitin over the counter and apply generously to the rectum every day  We will obtain your most recent colonoscopy report and pathology from Dr. Lorie Apley office  [place]:  . The following medication has been prescribed:  o Diltiazem 2% gel - please apply to rectum 5 times daily for 6 weeks o This medication MUST be compounded by a compounding pharmacy. Your prescription has been sent to:  [facility] Address: [address], [place],  [address]  [phone]  . Please DO NOT go directly from our office to pick up this medication! Give the pharmacy 1 day to process the prescription. Extra time is required for them to compound your medication.  FOLLOW UP:  . I would like for you to follow up with me in 6 weeks. Please call the office at [phone] to reschedule your appointment if you are unable to attend your appointment.  BMI:  . If you are age 58 or younger, your body mass index should be between 19-25. Your There is no height or weight on file to calculate BMI. If this is out of the aformentioned range listed, please consider follow up with your Primary Care Provider.   Thank you for trusting me with your gastrointestinal care!    Scarlette Shorts, MD

## 2020-06-24 NOTE — Progress Notes (Addendum)
HISTORY OF PRESENT ILLNESS:  Linda Reese is a 60 y.o. 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  Patient reports having undergone screening colonoscopy with Dr. Collene Mares around age 17.  She states the examination was unremarkable.  She tells me that she has had longstanding issues with constipation.  Though she typically has a daily bowel movement, often with straining.  She does notice with hard difficult bowel movements she will experience rectal pain with associated bleeding.  She is also noticed itching and pain on the skin around the anus.  She also has occasional bloating.  No other GI complaints.  She has completed her COVID vaccination series.  Review of outside data from November 2021 shows normal CBC with hemoglobin 12.9.  Normal comprehensive metabolic panel.  Abdominal ultrasound from November 2021 to evaluate right upper quadrant pain was unremarkable.  REVIEW OF SYSTEMS:  All non-GI ROS negative unless otherwise stated in HPI except for sinus allergy trouble, visual change, fatigue, night sweats, nosebleeds, muscle cramps, headaches, excessive urination, sore throat  Past Medical History:  Diagnosis Date  . Allergy   . Anal fissure   . Anxiety   . Chronic headaches   . COVID-19   . Diverticulosis   . Hemorrhoids   . Hypothyroidism   . IBS (irritable bowel syndrome)   . Vitamin D deficiency     Past Surgical History:  Procedure Laterality Date  . nexplanon  07/03/2010   insertion and removal  . OVARY BIOPSY      Social History [name]  reports that she has never smoked. She has never used smokeless tobacco. She reports current alcohol use. She reports that she does not use drugs.  family history includes Anemia in her father; COPD in her father; Colon polyps in her sister; Diabetes in her maternal grandmother;  Fibromyalgia in her sister; Heart attack in her maternal uncle and paternal grandfather; Heart disease in her maternal grandfather and mother; Hyperlipidemia in her father and mother; Irritable bowel syndrome in her sister; Lupus in her father; Stomach cancer in her maternal grandmother.  No Known Allergies     PHYSICAL EXAMINATION: Vital signs: BP 120/70 (BP Location: Left Arm, Patient Position: Sitting, Cuff Size: Normal)   Pulse 80   Ht 5' 1.25" (1.556 m) Comment: height measured without shoes  Wt 172 lb 2 oz (78.1 kg)   LMP 06/15/2015   BMI 32.26 kg/m   Constitutional: generally well-appearing, no acute distress Psychiatric: alert and oriented x3, cooperative Eyes: extraocular movements intact, anicteric, conjunctiva pink Mouth: oral pharynx moist, no lesions Neck: supple no lymphadenopathy Cardiovascular: heart regular rate and rhythm, no murmur Lungs: clear to auscultation bilaterally Abdomen: soft, nontender, nondistended, no obvious ascites, no peritoneal signs, normal bowel sounds, no organomegaly Rectal: External hemorrhoid tag.  Tender posterior anal fissure.  Brown stool.  Excoriated skin of the buttock around the anus with fissuring. Extremities: no cyanosis or edema lower extremity edema bilaterally Skin: no lesions on visible extremities Neuro: No focal deficits.  Cranial nerves intact  ASSESSMENT:  1.  Anal fissure 2.  Chronic constipation 3.  Excoriated perianal skin   PLAN:  1.  Metamucil 2 tablespoons daily 2.  2% diltiazem ointment to be applied to the anal sphincter 5 times daily as I have instructed.  Prescribed.  Medication risks reviewed 3.  Desitin to the excoriated perianal skin 4.  Obtain outside colonoscopy report for review 5.  Office follow-up in 6 weeks Total time 45 minutes was spent preparing to see the patient, reviewing test and x-rays, obtaining comprehensive history, performing medically appropriate physical examination, counseling the  patient regarding the above listed issues, prescribing medications, arranging follow-up, and documenting clinical information in the health record  ADDENDUM: Colonoscopy May 07, 2014 (Dr. Collene Mares): Normal colon and terminal ileum.  Complete exam with photographed landmarks and good preparation.  Follow-up in 10 years recommended

## 2020-07-10 ENCOUNTER — Other Ambulatory Visit: Payer: Self-pay | Admitting: Emergency Medicine

## 2020-07-10 DIAGNOSIS — E039 Hypothyroidism, unspecified: Secondary | ICD-10-CM

## 2020-08-30 ENCOUNTER — Ambulatory Visit: Payer: BC Managed Care – PPO | Admitting: Internal Medicine

## 2020-08-30 ENCOUNTER — Encounter: Payer: Self-pay | Admitting: Internal Medicine

## 2020-08-30 VITALS — BP 100/60 | HR 73 | Ht 62.0 in | Wt 178.0 lb

## 2020-08-30 DIAGNOSIS — K602 Anal fissure, unspecified: Secondary | ICD-10-CM

## 2020-08-30 DIAGNOSIS — K5900 Constipation, unspecified: Secondary | ICD-10-CM

## 2020-08-30 DIAGNOSIS — S30810A Abrasion of lower back and pelvis, initial encounter: Secondary | ICD-10-CM

## 2020-08-30 MED ORDER — DILTIAZEM GEL 2 %: 1.0000 "application " | Freq: Two times a day (BID) | CUTANEOUS | 1 refills | Status: DC

## 2020-08-30 NOTE — Progress Notes (Signed)
HISTORY OF PRESENT ILLNESS:  Linda Reese is a 60 y.o. 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  She was treated with Metamucil, diltiazem ointment, and Desitin.  Outside records were obtained after she left the office.  She did undergo complete colonoscopy in February 2016 with Dr. Collene Mares.  The examination of the colon and terminal ileum were normal.  The examination was complete with photographed landmarks and good preparation.  Appropriate follow-up in 10 years recommended.  The patient's please report today that her bowel habits have improved, her fissure has healed, and her excoriated perianal skin has healed.  No new complaints.  She does request a refill of her diltiazem ointment should it be needed in the future  REVIEW OF SYSTEMS:  All non-GI ROS negative unless otherwise stated in the HPI.  Past Medical History:  Diagnosis Date  . Allergy   . Anal fissure   . Anxiety   . Chronic headaches   . COVID-19   . Diverticulosis   . Hemorrhoids   . Hypothyroidism   . IBS (irritable bowel syndrome)   . Vitamin D deficiency     Past Surgical History:  Procedure Laterality Date  . nexplanon  07/03/2010   insertion and removal  . OVARY BIOPSY      Social History Linda Reese  reports that she has never smoked. She has never used smokeless tobacco. She reports current alcohol use. She reports that she does not use drugs.  family history includes Anemia in her father; COPD in her father; Colon polyps in her sister; Diabetes in her maternal grandmother; Fibromyalgia in her sister; Heart attack in her maternal uncle and paternal grandfather; Heart disease in her maternal grandfather and mother; Hyperlipidemia in her father and mother; Irritable bowel syndrome in her sister; Lupus in her father; Stomach cancer in her maternal grandmother.  No  Known Allergies     PHYSICAL EXAMINATION: Vital signs: BP 100/60   Pulse 73   Ht 5\' 2"  (1.575 m)   Wt 178 lb (80.7 kg)   LMP 06/15/2015   BMI 32.56 kg/m   Constitutional: generally well-appearing, no acute distress Psychiatric: alert and oriented x3, cooperative Eyes: Anicteric Mouth: Mask Abdomen: Not reexamined Skin: no lesions on visible extremities Neuro: No focal deficits  ASSESSMENT:  1.  Anal fissure.  Healed on diltiazem ointment 2.  Chronic constipation.  Improved with fiber 3.  Excoriated perianal skin healed with Desitin 4.  Normal colonoscopy with Dr. Collene Mares February 2016   PLAN:  1.  Continue fiber 2.  Refill diltiazem ointment 3.  Desitin as needed 4.  Repeat screening colonoscopy around February 2026 5.  Interval follow-up as needed

## 2020-08-30 NOTE — Patient Instructions (Signed)
If you are age 58 or older, your body mass index should be between 23-30. Your Body mass index is 32.56 kg/m. If this is out of the aforementioned range listed, please consider follow up with your Primary Care Provider.  If you are age 9 or younger, your body mass index should be between 19-25. Your Body mass index is 32.56 kg/m. If this is out of the aformentioned range listed, please consider follow up with your Primary Care Provider.   __________________________________________________________  The [facility] providers would like to encourage you to use [facility] to communicate with providers for non-urgent requests or questions.  Due to long hold times on the telephone, sending your provider a message by [facility] may be a faster and more efficient way to get a response.  Please allow 48 business hours for a response.  Please remember that this is for non-urgent requests.   We have sent the following medications to your pharmacy for you to pick up at your convenience:  Diltazem gel

## 2020-11-02 ENCOUNTER — Ambulatory Visit: Payer: BC Managed Care – PPO | Admitting: Emergency Medicine

## 2020-11-02 ENCOUNTER — Encounter: Payer: Self-pay | Admitting: Emergency Medicine

## 2020-11-02 ENCOUNTER — Other Ambulatory Visit: Payer: Self-pay

## 2020-11-02 VITALS — BP 128/80 | HR 101 | Temp 98.7°F | Ht 62.0 in | Wt 176.0 lb

## 2020-11-02 DIAGNOSIS — N879 Dysplasia of cervix uteri, unspecified: Secondary | ICD-10-CM | POA: Diagnosis not present

## 2020-11-02 DIAGNOSIS — E039 Hypothyroidism, unspecified: Secondary | ICD-10-CM

## 2020-11-02 DIAGNOSIS — Z13220 Encounter for screening for lipoid disorders: Secondary | ICD-10-CM | POA: Diagnosis not present

## 2020-11-02 DIAGNOSIS — Z130 Encounter for screening for diseases of the blood and blood-forming organs and certain disorders involving the immune mechanism: Secondary | ICD-10-CM

## 2020-11-02 DIAGNOSIS — N939 Abnormal uterine and vaginal bleeding, unspecified: Secondary | ICD-10-CM | POA: Diagnosis not present

## 2020-11-02 DIAGNOSIS — Z0001 Encounter for general adult medical examination with abnormal findings: Secondary | ICD-10-CM

## 2020-11-02 DIAGNOSIS — Z13228 Encounter for screening for other metabolic disorders: Secondary | ICD-10-CM

## 2020-11-02 DIAGNOSIS — Z1329 Encounter for screening for other suspected endocrine disorder: Secondary | ICD-10-CM | POA: Diagnosis not present

## 2020-11-02 LAB — CBC WITH DIFFERENTIAL/PLATELET
Basophils Absolute: 0 10*3/uL (ref 0.0–0.1)
Basophils Relative: 0.6 % (ref 0.0–3.0)
Eosinophils Absolute: 0.2 10*3/uL (ref 0.0–0.7)
Eosinophils Relative: 4.3 % (ref 0.0–5.0)
HCT: 39.3 % (ref 36.0–46.0)
Hemoglobin: 13.4 g/dL (ref 12.0–15.0)
Lymphocytes Relative: 30 % (ref 12.0–46.0)
Lymphs Abs: 1.5 10*3/uL (ref 0.7–4.0)
MCHC: 34.1 g/dL (ref 30.0–36.0)
MCV: 92.2 fl (ref 78.0–100.0)
Monocytes Absolute: 0.5 10*3/uL (ref 0.1–1.0)
Monocytes Relative: 9.1 % (ref 3.0–12.0)
Neutro Abs: 2.8 10*3/uL (ref 1.4–7.7)
Neutrophils Relative %: 56 % (ref 43.0–77.0)
Platelets: 220 10*3/uL (ref 150.0–400.0)
RBC: 4.27 Mil/uL (ref 3.87–5.11)
RDW: 13.2 % (ref 11.5–15.5)
WBC: 5 10*3/uL (ref 4.0–10.5)

## 2020-11-02 LAB — HEMOGLOBIN A1C: Hgb A1c MFr Bld: 5.4 % (ref 4.6–6.5)

## 2020-11-02 NOTE — Patient Instructions (Signed)
Health Maintenance, Female Adopting a healthy lifestyle and getting preventive care are important in promoting health and wellness. Ask your health care provider about: The right schedule for you to have regular tests and exams. Things you can do on your own to prevent diseases and keep yourself healthy. What should I know about diet, weight, and exercise? Eat a healthy diet  Eat a diet that includes plenty of vegetables, fruits, low-fat dairy products, and lean protein. Do not eat a lot of foods that are high in solid fats, added sugars, or sodium.  Maintain a healthy weight Body mass index (BMI) is used to identify weight problems. It estimates body fat based on height and weight. Your health care provider can help determineyour BMI and help you achieve or maintain a healthy weight. Get regular exercise Get regular exercise. This is one of the most important things you can do for your health. Most adults should: Exercise for at least 150 minutes each week. The exercise should increase your heart rate and make you sweat (moderate-intensity exercise). Do strengthening exercises at least twice a week. This is in addition to the moderate-intensity exercise. Spend less time sitting. Even light physical activity can be beneficial. Watch cholesterol and blood lipids Have your blood tested for lipids and cholesterol at 58 years of age, then havethis test every 5 years. Have your cholesterol levels checked more often if: Your lipid or cholesterol levels are high. You are older than 58 years of age. You are at high risk for heart disease. What should I know about cancer screening? Depending on your health history and family history, you may need to have cancer screening at various ages. This may include screening for: Breast cancer. Cervical cancer. Colorectal cancer. Skin cancer. Lung cancer. What should I know about heart disease, diabetes, and high blood pressure? Blood pressure and heart  disease High blood pressure causes heart disease and increases the risk of stroke. This is more likely to develop in people who have high blood pressure readings, are of African descent, or are overweight. Have your blood pressure checked: Every 3-5 years if you are 18-39 years of age. Every year if you are 40 years old or older. Diabetes Have regular diabetes screenings. This checks your fasting blood sugar level. Have the screening done: Once every three years after age 40 if you are at a normal weight and have a low risk for diabetes. More often and at a younger age if you are overweight or have a high risk for diabetes. What should I know about preventing infection? Hepatitis B If you have a higher risk for hepatitis B, you should be screened for this virus. Talk with your health care provider to find out if you are at risk forhepatitis B infection. Hepatitis C Testing is recommended for: Everyone born from 1945 through 1965. Anyone with known risk factors for hepatitis C. Sexually transmitted infections (STIs) Get screened for STIs, including gonorrhea and chlamydia, if: You are sexually active and are younger than 58 years of age. You are older than 58 years of age and your health care provider tells you that you are at risk for this type of infection. Your sexual activity has changed since you were last screened, and you are at increased risk for chlamydia or gonorrhea. Ask your health care provider if you are at risk. Ask your health care provider about whether you are at high risk for HIV. Your health care provider may recommend a prescription medicine to help   prevent HIV infection. If you choose to take medicine to prevent HIV, you should first get tested for HIV. You should then be tested every 3 months for as long as you are taking the medicine. Pregnancy If you are about to stop having your period (premenopausal) and you may become pregnant, seek counseling before you get  pregnant. Take 400 to 800 micrograms (mcg) of folic acid every day if you become pregnant. Ask for birth control (contraception) if you want to prevent pregnancy. Osteoporosis and menopause Osteoporosis is a disease in which the bones lose minerals and strength with aging. This can result in bone fractures. If you are 65 years old or older, or if you are at risk for osteoporosis and fractures, ask your health care provider if you should: Be screened for bone loss. Take a calcium or vitamin D supplement to lower your risk of fractures. Be given hormone replacement therapy (HRT) to treat symptoms of menopause. Follow these instructions at home: Lifestyle Do not use any products that contain nicotine or tobacco, such as cigarettes, e-cigarettes, and chewing tobacco. If you need help quitting, ask your health care provider. Do not use street drugs. Do not share needles. Ask your health care provider for help if you need support or information about quitting drugs. Alcohol use Do not drink alcohol if: Your health care provider tells you not to drink. You are pregnant, may be pregnant, or are planning to become pregnant. If you drink alcohol: Limit how much you use to 0-1 drink a day. Limit intake if you are breastfeeding. Be aware of how much alcohol is in your drink. In the [place] one drink equals one 12 oz bottle of beer (355 mL), one 5 oz glass of wine (148 mL), or one 1 oz glass of hard liquor (44 mL). General instructions Schedule regular health, dental, and eye exams. Stay current with your vaccines. Tell your health care provider if: You often feel depressed. You have ever been abused or do not feel safe at home. Summary Adopting a healthy lifestyle and getting preventive care are important in promoting health and wellness. Follow your health care provider's instructions about healthy diet, exercising, and getting tested or screened for diseases. Follow your health care provider's  instructions on monitoring your cholesterol and blood pressure. This information is not intended to replace advice given to you by your health care provider. Make sure you discuss any questions you have with your healthcare provider. Document Revised: 03/05/2018 Document Reviewed: 03/05/2018 Elsevier Patient Education  2022 Elsevier Inc.  

## 2020-11-02 NOTE — Progress Notes (Signed)
Linda Reese 60 y.o.   Chief Complaint  Patient presents with   Vaginal Bleeding    Pt would like a referral to GYN, pt would like a physical    HISTORY OF PRESENT ILLNESS: This is a 60 y.o. female here for annual exam and evaluation of abnormal vaginal bleeding. Recently went to [place] and developed abnormal vaginal bleeding.  Went to see her gynecologist who did a Pap smear and cervical biopsy which showed moderate dysplasia with acute on chronic cervicitis.  Advised to follow-up with gynecologist once [place]. Patient is relatively healthy.  Has history of hypothyroidism on Synthroid 75 mcg daily.  No other chronic medical problems. Non-smoker with a healthy lifestyle.  Vaginal Bleeding Pertinent negatives include no abdominal pain, chills, diarrhea, dysuria, fever, headaches, hematuria, nausea, rash, sore throat or vomiting.    Prior to Admission medications   Medication Sig Start Date End Date Taking? Authorizing Provider  diltiazem 2 % GEL Apply 1 application topically 2 (two) times daily. 08/30/20  Yes Irene Shipper, MD  Ibuprofen (ADVIL PO) Take by mouth as needed.   Yes [provider]  levothyroxine (SYNTHROID) 75 MCG tablet TAKE 1 TABLET(75 MCG) BY MOUTH DAILY 07/10/20  Yes Horald Pollen, MD  PRESCRIPTION MEDICATION Hormone pellets- inserted into buttock on 08/03/20- rx'ed by [facility]   Yes [provider]    No Known Allergies  Patient Active Problem List   Diagnosis Date Noted   Thyroid activity decreased 06/10/2014   Other specified hypothyroidism 04/27/2014   Hemorrhoids 05/09/2012   Family history of lupus erythematosus 05/09/2012   DYSPEPSIA 07/06/2008   B12 DEFICIENCY 11/24/2007    Past Medical History:  Diagnosis Date   Allergy    Anal fissure    Anxiety    Chronic headaches    COVID-19    Diverticulosis    Hemorrhoids    Hypothyroidism    IBS (irritable bowel syndrome)    Vitamin D deficiency      Past Surgical History:  Procedure Laterality Date   nexplanon  07/03/2010   insertion and removal   OVARY BIOPSY      Social History   Socioeconomic History   Marital status: Married    Spouse name: Not on file   Number of children: 2   Years of education: Not on file   Highest education level: Not on file  Occupational History   Occupation: Teacher  Tobacco Use   Smoking status: Never   Smokeless tobacco: Never  Vaping Use   Vaping Use: Never used  Substance and Sexual Activity   Alcohol use: Yes    Alcohol/week: 0.0 standard drinks    Comment: "sometimes" weekend wine and margarita   Drug use: No   Sexual activity: Yes    Birth control/protection: None    Comment: nexplanon. 1st intercourse- 26, partners- 1  Other Topics Concern   Not on file  Social History Narrative   Not on file   Social Determinants of Health   Financial Resource Strain: Not on file  Food Insecurity: Not on file  Transportation Needs: Not on file  Physical Activity: Not on file  Stress: Not on file  Social Connections: Not on file  Intimate Partner Violence: Not on file    Family History  Problem Relation Age of Onset   Diabetes Maternal Grandmother    Stomach cancer Maternal Grandmother    Heart disease Maternal Grandfather    Hyperlipidemia Mother  Heart disease Mother    Lupus Father    Anemia Father    COPD Father    Hyperlipidemia Father    Heart attack Paternal Grandfather    Colon polyps Sister    Heart attack Maternal Uncle    Irritable bowel syndrome Sister    Fibromyalgia Sister    Colon cancer Neg Hx      Review of Systems  Constitutional: Negative.  Negative for chills and fever.  HENT: Negative.  Negative for congestion and sore throat.   Respiratory: Negative.  Negative for cough and shortness of breath.   Cardiovascular: Negative.  Negative for chest pain and palpitations.  Gastrointestinal:  Negative for abdominal pain, diarrhea, nausea and vomiting.   Genitourinary:  Positive for vaginal bleeding. Negative for dysuria and hematuria.       Abnormal vaginal bleeding  Musculoskeletal: Negative.   Skin: Negative.  Negative for rash.  Neurological: Negative.  Negative for dizziness and headaches.  All other systems reviewed and are negative.  Vitals:   11/02/20 1453  BP: 128/80  Pulse: (!) 101  Temp: 98.7 F (37.1 C)  SpO2: 96%    Physical Exam Vitals reviewed.  Constitutional:      Appearance: Normal appearance.  HENT:     Head: Normocephalic.  Eyes:     Extraocular Movements: Extraocular movements intact.     Conjunctiva/sclera: Conjunctivae normal.     Pupils: Pupils are equal, round, and reactive to light.  Cardiovascular:     Rate and Rhythm: Normal rate and regular rhythm.     Pulses: Normal pulses.     Heart sounds: Normal heart sounds.  Pulmonary:     Effort: Pulmonary effort is normal.     Breath sounds: Normal breath sounds.  Abdominal:     General: Bowel sounds are normal. There is no distension.     Palpations: Abdomen is soft.     Tenderness: There is no abdominal tenderness.  Musculoskeletal:        General: Normal range of motion.     Cervical back: Normal range of motion and neck supple.     Right lower leg: No edema.     Left lower leg: No edema.  Skin:    General: Skin is warm and dry.     Capillary Refill: Capillary refill takes less than 2 seconds.  Neurological:     General: No focal deficit present.     Mental Status: She is alert and oriented to person, place, and time.  Psychiatric:        Mood and Affect: Mood normal.        Behavior: Behavior normal.     ASSESSMENT & PLAN: Linda Reese was seen today for vaginal bleeding.  Diagnoses and all orders for this visit:  Encounter for general adult medical examination with abnormal findings  Abnormal vaginal bleeding -     CBC with Differential -     Ambulatory referral to Gynecology  Cervical dysplasia -     Ambulatory referral to  Gynecology  Hypothyroidism, unspecified type -     TSH  Screening for deficiency anemia -     CBC with Differential -     Hemoglobin A1c  Screening for lipoid disorders -     Lipid panel  Screening for endocrine, metabolic and immunity disorder -     Comprehensive metabolic panel  Modifiable risk factors discussed with patient. Anticipatory guidance according to age provided. The following topics were also discussed: Social Determinants of  Health Smoking Diet and nutrition Benefits of exercise Cancer screening and review of most recent mammogram and colonoscopy Vaccinations recommendations Need for GYN evaluation of possible cervical cancer Cardiovascular risk assessment Mental health including depression and anxiety Fall and accident prevention  Patient Instructions  Health Maintenance, Female Adopting a healthy lifestyle and getting preventive care are important in promoting health and wellness. Ask your health care provider about: The right schedule for you to have regular tests and exams. Things you can do on your own to prevent diseases and keep yourself healthy. What should I know about diet, weight, and exercise? Eat a healthy diet  Eat a diet that includes plenty of vegetables, fruits, low-fat dairy products, and lean protein. Do not eat a lot of foods that are high in solid fats, added sugars, or sodium.  Maintain a healthy weight Body mass index (BMI) is used to identify weight problems. It estimates body fat based on height and weight. Your health care provider can help determineyour BMI and help you achieve or maintain a healthy weight. Get regular exercise Get regular exercise. This is one of the most important things you can do for your health. Most adults should: Exercise for at least 150 minutes each week. The exercise should increase your heart rate and make you sweat (moderate-intensity exercise). Do strengthening exercises at least twice a week. This is  in addition to the moderate-intensity exercise. Spend less time sitting. Even light physical activity can be beneficial. Watch cholesterol and blood lipids Have your blood tested for lipids and cholesterol at 58 years of age, then havethis test every 5 years. Have your cholesterol levels checked more often if: Your lipid or cholesterol levels are high. You are older than 58 years of age. You are at high risk for heart disease. What should I know about cancer screening? Depending on your health history and family history, you may need to have cancer screening at various ages. This may include screening for: Breast cancer. Cervical cancer. Colorectal cancer. Skin cancer. Lung cancer. What should I know about heart disease, diabetes, and high blood pressure? Blood pressure and heart disease High blood pressure causes heart disease and increases the risk of stroke. This is more likely to develop in people who have high blood pressure readings, are of African descent, or are overweight. Have your blood pressure checked: Every 3-5 years if you are 64-90 years of age. Every year if you are 11 years old or older. Diabetes Have regular diabetes screenings. This checks your fasting blood sugar level. Have the screening done: Once every three years after age 68 if you are at a normal weight and have a low risk for diabetes. More often and at a younger age if you are overweight or have a high risk for diabetes. What should I know about preventing infection? Hepatitis B If you have a higher risk for hepatitis B, you should be screened for this virus. Talk with your health care provider to find out if you are at risk forhepatitis B infection. Hepatitis C Testing is recommended for: Everyone born from [birth year] through 1965. Anyone with known risk factors for hepatitis C. Sexually transmitted infections (STIs) Get screened for STIs, including gonorrhea and chlamydia, if: You are sexually active and are  younger than 58 years of age. You are older than 58 years of age and your health care provider tells you that you are at risk for this type of infection. Your sexual activity has changed since you were last  screened, and you are at increased risk for chlamydia or gonorrhea. Ask your health care provider if you are at risk. Ask your health care provider about whether you are at high risk for HIV. Your health care provider may recommend a prescription medicine to help prevent HIV infection. If you choose to take medicine to prevent HIV, you should first get tested for HIV. You should then be tested every 3 months for as long as you are taking the medicine. Pregnancy If you are about to stop having your period (premenopausal) and you may become pregnant, seek counseling before you get pregnant. Take 400 to 800 micrograms (mcg) of folic acid every day if you become pregnant. Ask for birth control (contraception) if you want to prevent pregnancy. Osteoporosis and menopause Osteoporosis is a disease in which the bones lose minerals and strength with aging. This can result in bone fractures. If you are 44 years old or older, or if you are at risk for osteoporosis and fractures, ask your health care provider if you should: Be screened for bone loss. Take a calcium or vitamin D supplement to lower your risk of fractures. Be given hormone replacement therapy (HRT) to treat symptoms of menopause. Follow these instructions at home: Lifestyle Do not use any products that contain nicotine or tobacco, such as cigarettes, e-cigarettes, and chewing tobacco. If you need help quitting, ask your health care provider. Do not use street drugs. Do not share needles. Ask your health care provider for help if you need support or information about quitting drugs. Alcohol use Do not drink alcohol if: Your health care provider tells you not to drink. You are pregnant, may be pregnant, or are planning to become  pregnant. If you drink alcohol: Limit how much you use to 0-1 drink a day. Limit intake if you are breastfeeding. Be aware of how much alcohol is in your drink. In the [place] one drink equals one 12 oz bottle of beer (355 mL), one 5 oz glass of wine (148 mL), or one 1 oz glass of hard liquor (44 mL). General instructions Schedule regular health, dental, and eye exams. Stay current with your vaccines. Tell your health care provider if: You often feel depressed. You have ever been abused or do not feel safe at home. Summary Adopting a healthy lifestyle and getting preventive care are important in promoting health and wellness. Follow your health care provider's instructions about healthy diet, exercising, and getting tested or screened for diseases. Follow your health care provider's instructions on monitoring your cholesterol and blood pressure. This information is not intended to replace advice given to you by your health care provider. Make sure you discuss any questions you have with your healthcare provider. Document Revised: 03/05/2018 Document Reviewed: 03/05/2018 Elsevier Patient Education  2022 [place], MD [facility]

## 2020-11-03 LAB — LIPID PANEL
Cholesterol: 174 mg/dL (ref 0–200)
HDL: 44.7 mg/dL (ref >39.00)
LDL Cholesterol: 96 mg/dL (ref 0–99)
NonHDL: 129.06
Total CHOL/HDL Ratio: 4
Triglycerides: 164 mg/dL — ABNORMAL HIGH (ref 0.0–149.0)
VLDL: 32.8 mg/dL (ref 0.0–40.0)

## 2020-11-03 LAB — COMPREHENSIVE METABOLIC PANEL
ALT: 26 U/L (ref 0–35)
AST: 28 U/L (ref 0–37)
Albumin: 4.1 g/dL (ref 3.5–5.2)
Alkaline Phosphatase: 82 U/L (ref 39–117)
BUN: 15 mg/dL (ref 6–23)
CO2: 20 mEq/L (ref 19–32)
Calcium: 9.2 mg/dL (ref 8.4–10.5)
Chloride: 107 mEq/L (ref 96–112)
Creatinine, Ser: 0.78 mg/dL (ref 0.40–1.20)
GFR: 84.17 mL/min (ref >60.00)
Glucose, Bld: 79 mg/dL (ref 70–99)
Potassium: 4.2 mEq/L (ref 3.5–5.1)
Sodium: 143 mEq/L (ref 135–145)
Total Bilirubin: 0.5 mg/dL (ref 0.2–1.2)
Total Protein: 7.2 g/dL (ref 6.0–8.3)

## 2020-11-03 LAB — TSH: TSH: 5.23 u[IU]/mL (ref 0.35–5.50)

## 2020-11-04 ENCOUNTER — Encounter: Payer: Self-pay | Admitting: Emergency Medicine

## 2020-11-04 LAB — HM MAMMOGRAPHY

## 2020-11-07 ENCOUNTER — Encounter: Payer: Self-pay | Admitting: Emergency Medicine

## 2020-11-09 NOTE — Telephone Encounter (Signed)
She is inquiring about gynecology referral.  No contact yet.  Please look into this.  Thanks.

## 2020-11-16 NOTE — Telephone Encounter (Signed)
Called and spoke with pt about referral to OBGYN. She has an appointment 12/08/20.

## 2020-11-16 NOTE — Telephone Encounter (Signed)
Thank you :)

## 2020-11-25 ENCOUNTER — Encounter: Payer: Self-pay | Admitting: Emergency Medicine

## 2020-12-08 ENCOUNTER — Other Ambulatory Visit: Payer: Self-pay

## 2020-12-08 ENCOUNTER — Ambulatory Visit: Payer: BC Managed Care – PPO | Admitting: Obstetrics & Gynecology

## 2020-12-08 ENCOUNTER — Encounter: Payer: Self-pay | Admitting: Obstetrics & Gynecology

## 2020-12-08 ENCOUNTER — Other Ambulatory Visit (HOSPITAL_COMMUNITY)
Admission: RE | Admit: 2020-12-08 | Discharge: 2020-12-08 | Disposition: A | Discharge status: Home / Self Care | Payer: BC Managed Care – PPO | Source: Ambulatory Visit | Attending: Obstetrics & Gynecology | Admitting: Obstetrics & Gynecology

## 2020-12-08 VITALS — BP 106/72 | HR 80 | Ht 61.0 in | Wt 171.0 lb

## 2020-12-08 DIAGNOSIS — Z01419 Encounter for gynecological examination (general) (routine) without abnormal findings: Secondary | ICD-10-CM

## 2020-12-08 DIAGNOSIS — N879 Dysplasia of cervix uteri, unspecified: Secondary | ICD-10-CM | POA: Diagnosis present

## 2020-12-08 NOTE — Progress Notes (Signed)
Patient ID: Linda Reese, female   DOB: 25-Oct-1960, 60 y.o.   MRN: [id number]  Chief Complaint  Patient presents with   New Patient (Initial Visit)    Pap and Postmenopausal bleeding    HPI Linda Reese is a 60 y.o. female.  [id number] Patient's last menstrual period was 06/15/2015. She was seen for pap in [place] this year and diagnosed with moderate dysplasia but she does not have the record with her. An [place] was done then for some vaginal spotting. She had dysuria and frequency but no UA was done. HPI  Past Medical History:  Diagnosis Date   Allergy    Anal fissure    Anxiety    Chronic headaches    COVID-19    Diverticulosis    Hemorrhoids    Hypothyroidism    IBS (irritable bowel syndrome)    Vitamin D deficiency     Past Surgical History:  Procedure Laterality Date   nexplanon  07/03/2010   insertion and removal   OVARY BIOPSY      Family History  Problem Relation Age of Onset   Diabetes Maternal Grandmother    Stomach cancer Maternal Grandmother    Heart disease Maternal Grandfather    Hyperlipidemia Mother    Heart disease Mother    Lupus Father    Anemia Father    COPD Father    Hyperlipidemia Father    Heart attack Paternal Grandfather    Colon polyps Sister    Heart attack Maternal Uncle    Irritable bowel syndrome Sister    Fibromyalgia Sister    Colon cancer Neg Hx     Social History Social History   Tobacco Use   Smoking status: Never   Smokeless tobacco: Never  Vaping Use   Vaping Use: Never used  Substance Use Topics   Alcohol use: Yes    Alcohol/week: 0.0 standard drinks    Comment: "sometimes" weekend wine and margarita   Drug use: No    No Known Allergies  Current Outpatient Medications  Medication Sig Dispense Refill   diltiazem 2 % GEL Apply 1 application topically 2 (two) times daily. 30 g 1   Ibuprofen (ADVIL PO) Take by mouth as needed.     levothyroxine (SYNTHROID) 75 MCG tablet TAKE 1 TABLET(75 MCG) BY MOUTH DAILY 90  tablet 1   phentermine 15 MG capsule Take 15 mg by mouth every morning.     PRESCRIPTION MEDICATION Hormone pellets- inserted into buttock on 08/03/20- rx'ed by [facility]     No current facility-administered medications for this visit.    Review of Systems Review of Systems  Constitutional: Negative.   Respiratory: Negative.    Gastrointestinal: Negative.   Genitourinary:  Negative for dysuria, frequency, hematuria, menstrual problem and pelvic pain.   Blood pressure 106/72, pulse 80, height '5\' 1"'$  (1.549 m), weight 171 lb (77.6 kg), last menstrual period 06/15/2015.  Physical Exam Physical Exam Vitals and nursing note reviewed. Exam conducted with a chaperone present.  Constitutional:      Appearance: Normal appearance.  Pulmonary:     Effort: Pulmonary effort is normal.  Abdominal:     Palpations: Abdomen is soft.  Genitourinary:    General: Normal vulva.     Exam position: Lithotomy position.     Vagina: Normal.     Cervix: No discharge.     Uterus: Normal.      Adnexa: Right adnexa normal and left adnexa normal.     Rectum:  External hemorrhoid present.  Musculoskeletal:        General: Normal range of motion.  Skin:    General: Skin is warm and dry.  Neurological:     Mental Status: She is alert.  Psychiatric:        Mood and Affect: Mood normal.        Behavior: Behavior normal.    Data Reviewed Office note Dr. 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

## 2020-12-08 NOTE — Progress Notes (Signed)
Pt presents today as new patient for pap smear and evaluation of PMB. States she went to [place] earlier this year and had an abnormal pap but we have no records. Will repeat pap. States she had an episode of vaginal bleeding in July. Pt states she has only had the one episode. LMP: approx. 4 years ago.

## 2020-12-14 LAB — CYTOLOGY - PAP
Comment: NEGATIVE
Diagnosis: UNDETERMINED — AB
High risk HPV: NEGATIVE

## 2020-12-21 ENCOUNTER — Encounter: Payer: Self-pay | Admitting: *Deleted

## 2020-12-21 ENCOUNTER — Telehealth: Payer: Self-pay | Admitting: *Deleted

## 2020-12-21 NOTE — Telephone Encounter (Signed)
Returned TC to patient regarding abnormal pap report in [place]. No answer. Left VM for patient to call the office. Message sent via MyChart reassuring patient that I reviewed her results with another provider, Dr. Jodi Mourning, and that the likely recommended follow up would be Pap in one year.

## 2020-12-27 ENCOUNTER — Encounter: Payer: Self-pay | Admitting: Emergency Medicine

## 2020-12-28 ENCOUNTER — Other Ambulatory Visit: Payer: Self-pay | Admitting: Emergency Medicine

## 2020-12-28 DIAGNOSIS — E039 Hypothyroidism, unspecified: Secondary | ICD-10-CM

## 2020-12-28 MED ORDER — LEVOTHYROXINE SODIUM 75 MCG PO TABS: 75.0000 ug | ORAL_TABLET | Freq: Every day | ORAL | 3 refills | Status: DC

## 2021-03-24 ENCOUNTER — Encounter: Payer: Self-pay | Admitting: Emergency Medicine

## 2021-04-18 ENCOUNTER — Other Ambulatory Visit: Payer: Self-pay | Admitting: Emergency Medicine

## 2021-04-18 DIAGNOSIS — E039 Hypothyroidism, unspecified: Secondary | ICD-10-CM

## 2021-04-18 MED ORDER — LEVOTHYROXINE SODIUM 75 MCG PO TABS: 75.0000 ug | ORAL_TABLET | Freq: Every day | ORAL | 3 refills | Status: DC

## 2021-07-05 ENCOUNTER — Encounter: Payer: Self-pay | Admitting: Emergency Medicine

## 2021-07-05 NOTE — Telephone Encounter (Signed)
Recommend to be evaluated at the urgent care center today.

## 2021-11-18 LAB — HM MAMMOGRAPHY

## 2022-01-08 ENCOUNTER — Encounter: Payer: Self-pay | Admitting: Emergency Medicine

## 2022-01-08 ENCOUNTER — Ambulatory Visit (INDEPENDENT_AMBULATORY_CARE_PROVIDER_SITE_OTHER): Payer: BC Managed Care – PPO

## 2022-01-08 ENCOUNTER — Ambulatory Visit: Payer: BC Managed Care – PPO | Admitting: Emergency Medicine

## 2022-01-08 VITALS — BP 110/68 | HR 78 | Temp 98.5°F | Ht 61.0 in | Wt 171.2 lb

## 2022-01-08 DIAGNOSIS — M25551 Pain in right hip: Secondary | ICD-10-CM | POA: Diagnosis not present

## 2022-01-08 DIAGNOSIS — M545 Low back pain, unspecified: Secondary | ICD-10-CM

## 2022-01-08 DIAGNOSIS — R109 Unspecified abdominal pain: Secondary | ICD-10-CM | POA: Diagnosis not present

## 2022-01-08 DIAGNOSIS — E039 Hypothyroidism, unspecified: Secondary | ICD-10-CM | POA: Diagnosis not present

## 2022-01-08 DIAGNOSIS — M791 Myalgia, unspecified site: Secondary | ICD-10-CM | POA: Diagnosis not present

## 2022-01-08 DIAGNOSIS — L723 Sebaceous cyst: Secondary | ICD-10-CM

## 2022-01-08 LAB — CBC WITH DIFFERENTIAL/PLATELET
Basophils Absolute: 0 10*3/uL (ref 0.0–0.1)
Basophils Relative: 0.5 % (ref 0.0–3.0)
Eosinophils Absolute: 0.2 10*3/uL (ref 0.0–0.7)
Eosinophils Relative: 2.9 % (ref 0.0–5.0)
HCT: 36.7 % (ref 36.0–46.0)
Hemoglobin: 12.8 g/dL (ref 12.0–15.0)
Lymphocytes Relative: 25 % (ref 12.0–46.0)
Lymphs Abs: 1.6 10*3/uL (ref 0.7–4.0)
MCHC: 35 g/dL (ref 30.0–36.0)
MCV: 91.2 fl (ref 78.0–100.0)
Monocytes Absolute: 0.5 10*3/uL (ref 0.1–1.0)
Monocytes Relative: 7.8 % (ref 3.0–12.0)
Neutro Abs: 4 10*3/uL (ref 1.4–7.7)
Neutrophils Relative %: 63.8 % (ref 43.0–77.0)
Platelets: 228 10*3/uL (ref 150.0–400.0)
RBC: 4.03 Mil/uL (ref 3.87–5.11)
RDW: 13.5 % (ref 11.5–15.5)
WBC: 6.2 10*3/uL (ref 4.0–10.5)

## 2022-01-08 LAB — URINALYSIS
Bilirubin Urine: NEGATIVE
Hgb urine dipstick: NEGATIVE
Ketones, ur: NEGATIVE
Leukocytes,Ua: NEGATIVE
Nitrite: NEGATIVE
Specific Gravity, Urine: 1.015 (ref 1.000–1.030)
Total Protein, Urine: NEGATIVE
Urine Glucose: NEGATIVE
Urobilinogen, UA: 0.2 (ref 0.0–1.0)
pH: 7 (ref 5.0–8.0)

## 2022-01-08 LAB — COMPREHENSIVE METABOLIC PANEL
ALT: 23 U/L (ref 0–35)
AST: 18 U/L (ref 0–37)
Albumin: 4.2 g/dL (ref 3.5–5.2)
Alkaline Phosphatase: 94 U/L (ref 39–117)
BUN: 13 mg/dL (ref 6–23)
CO2: 29 mEq/L (ref 19–32)
Calcium: 9 mg/dL (ref 8.4–10.5)
Chloride: 105 mEq/L (ref 96–112)
Creatinine, Ser: 0.64 mg/dL (ref 0.40–1.20)
GFR: 97.13 mL/min (ref >60.00)
Glucose, Bld: 88 mg/dL (ref 70–99)
Potassium: 3.9 mEq/L (ref 3.5–5.1)
Sodium: 142 mEq/L (ref 135–145)
Total Bilirubin: 0.5 mg/dL (ref 0.2–1.2)
Total Protein: 7.5 g/dL (ref 6.0–8.3)

## 2022-01-08 LAB — VITAMIN D 25 HYDROXY (VIT D DEFICIENCY, FRACTURES): VITD: 29.51 ng/mL — ABNORMAL LOW (ref 30.00–100.00)

## 2022-01-08 LAB — HEMOGLOBIN A1C: Hgb A1c MFr Bld: 5.6 % (ref 4.6–6.5)

## 2022-01-08 LAB — CK: Total CK: 20 U/L (ref 7–177)

## 2022-01-08 LAB — LIPID PANEL
Cholesterol: 152 mg/dL (ref 0–200)
HDL: 40.7 mg/dL (ref >39.00)
LDL Cholesterol: 92 mg/dL (ref 0–99)
NonHDL: 110.98
Total CHOL/HDL Ratio: 4
Triglycerides: 94 mg/dL (ref 0.0–149.0)
VLDL: 18.8 mg/dL (ref 0.0–40.0)

## 2022-01-08 LAB — VITAMIN B12: Vitamin B-12: 553 pg/mL (ref 211–911)

## 2022-01-08 LAB — SEDIMENTATION RATE: Sed Rate: 49 mm/hr — ABNORMAL HIGH (ref 0–30)

## 2022-01-08 LAB — MAGNESIUM: Magnesium: 2 mg/dL (ref 1.5–2.5)

## 2022-01-08 MED ORDER — LEVOTHYROXINE SODIUM 75 MCG PO TABS: 75.0000 ug | ORAL_TABLET | Freq: Every day | ORAL | 3 refills | Status: DC

## 2022-01-08 NOTE — Patient Instructions (Signed)

## 2022-01-08 NOTE — Assessment & Plan Note (Signed)
Negative x-rays.  No red flag signs or symptoms. Most likely related to activities of daily living. Review Naprosyn as needed.

## 2022-01-08 NOTE — Assessment & Plan Note (Signed)
Clinically euthyroid. Thyroid panel with TSH checked today. Continue Synthroid 75 mcg daily.

## 2022-01-08 NOTE — Assessment & Plan Note (Signed)
Blood work done today including CPK and markers of inflammation. Continue naproxen as needed for pain.

## 2022-01-08 NOTE — Assessment & Plan Note (Signed)
Most likely musculoskeletal. We will check urinalysis and blood work today. Differential diagnosis discussed with patient.

## 2022-01-08 NOTE — Assessment & Plan Note (Signed)
Mild degenerative changes seen on x-ray. Continue Naprosyn as needed for pain. Most likely mechanical in nature.

## 2022-01-08 NOTE — Progress Notes (Signed)
Linda Reese 61 y.o.   Chief Complaint  Patient presents with   Muscle Pain    Patient having muscle ache pain that radiates from her neck down her arm, Hip pain, back pain , patient requesting lab work    Headache    HISTORY OF PRESENT ILLNESS: This is a 60 y.o. female complaining of pain to right lumbar hip area for several weeks. Intermittent sharp pain worse with movement and better with rest.  No associated symptoms. No pain radiation. Occasional intermittent headaches with muscle aches. No other complaints or medical concerns today.  HPI   Prior to Admission medications   Medication Sig Start Date End Date Taking? Authorizing Provider  amoxicillin (AMOXIL) 875 MG tablet Take 875 mg by mouth 2 (two) times daily. 01/07/22   [provider]  levothyroxine (SYNTHROID) 75 MCG tablet Take 1 tablet (75 mcg total) by mouth daily before breakfast. 04/18/21 07/17/21  Horald Pollen, MD    No Known Allergies  Patient Active Problem List   Diagnosis Date Noted   Thyroid activity decreased 06/10/2014   Other specified hypothyroidism 04/27/2014   Hemorrhoids 05/09/2012   Family history of lupus erythematosus 05/09/2012   DYSPEPSIA 07/06/2008   B12 DEFICIENCY 11/24/2007    Past Medical History:  Diagnosis Date   Allergy    Anal fissure    Anxiety    Chronic headaches    COVID-19    Diverticulosis    Hemorrhoids    Hypothyroidism    IBS (irritable bowel syndrome)    Vitamin D deficiency     Past Surgical History:  Procedure Laterality Date   nexplanon  07/03/2010   insertion and removal   OVARY BIOPSY      Social History   Socioeconomic History   Marital status: Married    Spouse name: Not on file   Number of children: 2   Years of education: Not on file   Highest education level: Not on file  Occupational History   Occupation: Teacher  Tobacco Use   Smoking status: Never   Smokeless tobacco: Never  Vaping Use   Vaping Use: Never used   Substance and Sexual Activity   Alcohol use: Yes    Alcohol/week: 0.0 standard drinks of alcohol    Comment: "sometimes" weekend wine and margarita   Drug use: No   Sexual activity: Yes    Birth control/protection: None    Comment: nexplanon. 1st intercourse- 26, partners- 1  Other Topics Concern   Not on file  Social History Narrative   Not on file   Social Determinants of Health   Financial Resource Strain: Not on file  Food Insecurity: Not on file  Transportation Needs: Not on file  Physical Activity: Not on file  Stress: Not on file  Social Connections: Not on file  Intimate Partner Violence: Not on file    Family History  Problem Relation Age of Onset   Diabetes Maternal Grandmother    Stomach cancer Maternal Grandmother    Heart disease Maternal Grandfather    Hyperlipidemia Mother    Heart disease Mother    Lupus Father    Anemia Father    COPD Father    Hyperlipidemia Father    Heart attack Paternal Grandfather    Colon polyps Sister    Heart attack Maternal Uncle    Irritable bowel syndrome Sister    Fibromyalgia Sister    Colon cancer Neg Hx      Review of Systems  Constitutional:  Positive for malaise/fatigue. Negative for chills and fever.  HENT:  Negative for congestion and sore throat.   Respiratory: Negative.  Negative for cough and shortness of breath.   Cardiovascular: Negative.  Negative for chest pain and palpitations.  Gastrointestinal: Negative.  Negative for abdominal pain, diarrhea, nausea and vomiting.  Genitourinary: Negative.   Musculoskeletal:  Positive for joint pain and myalgias.  Skin: Negative.  Negative for rash.  Neurological:  Positive for headaches. Negative for dizziness.  All other systems reviewed and are negative.  Today's Vitals   01/08/22 1413  BP: 110/68  Pulse: 78  Temp: 98.5 F (36.9 C)  TempSrc: Oral  SpO2: 97%  Weight: 171 lb 4 oz (77.7 kg)  Height: '5\' 1"'$  (1.549 m)   Body mass index is 32.36  kg/m.   Physical Exam Vitals reviewed.  Constitutional:      Appearance: She is well-developed.  HENT:     Head: Normocephalic.     Mouth/Throat:     Mouth: Mucous membranes are moist.     Pharynx: Oropharynx is clear.  Eyes:     Extraocular Movements: Extraocular movements intact.     Conjunctiva/sclera: Conjunctivae normal.     Pupils: Pupils are equal, round, and reactive to light.  Cardiovascular:     Rate and Rhythm: Normal rate and regular rhythm.     Pulses: Normal pulses.     Heart sounds: Normal heart sounds.  Pulmonary:     Effort: Pulmonary effort is normal.     Breath sounds: Normal breath sounds.  Abdominal:     Palpations: Abdomen is soft.     Tenderness: There is no abdominal tenderness.  Musculoskeletal:        General: Normal range of motion.     Cervical back: No tenderness.  Lymphadenopathy:     Cervical: No cervical adenopathy.  Skin:    General: Skin is warm and dry.     Comments: Medium size nontender left forearm sebaceous cyst.  No infection.  Neurological:     General: No focal deficit present.     Mental Status: She is alert and oriented to person, place, and time.  Psychiatric:        Mood and Affect: Mood normal.        Behavior: Behavior normal.     DG Lumbar Spine 2-3 Views  Result Date: 01/08/2022 CLINICAL DATA:  Right back pain EXAM: LUMBAR SPINE - 2-3 VIEW COMPARISON:  12/25/2009 FINDINGS: There is no evidence of lumbar spine fracture. Alignment is normal. Degenerative endplate spurring at O8-4. Intervertebral disc spaces are relatively well maintained. No appreciable facet arthropathy. SI joints within normal limits. IMPRESSION: Mild degenerative endplate spurring at Z6-6. No acute findings. Electronically Signed   By: Davina Poke D.O.   On: 01/08/2022 15:10   DG HIP UNILAT W OR W/O PELVIS 2-3 VIEWS RIGHT  Result Date: 01/08/2022 CLINICAL DATA:  Chronic right hip pain EXAM: DG HIP (WITH OR WITHOUT PELVIS) 2-3V RIGHT  COMPARISON:  CT 01/26/2020 FINDINGS: There is no evidence of hip fracture or dislocation. Hip joint space is preserved. There is no evidence of arthropathy or other focal bone abnormality. IMPRESSION: Negative. Electronically Signed   By: Davina Poke D.O.   On: 01/08/2022 15:05    ASSESSMENT & PLAN: A total of 46 minutes was spent with the patient and counseling/coordination of care regarding preparing for this visit, review of most recent office visit notes, review of most recent blood work results, review  of multiple chronic medical problems and their management, review of all medications, review of x-ray reports done today, need for blood work, prognosis, documentation, and need for follow-up.  Problem List Items Addressed This Visit       Endocrine   Thyroid activity decreased    Clinically euthyroid. Thyroid panel with TSH checked today. Continue Synthroid 75 mcg daily.      Relevant Medications   levothyroxine (SYNTHROID) 75 MCG tablet     Musculoskeletal and Integument   Sebaceous cyst   Relevant Orders   Ambulatory referral to Dermatology     Other   Myalgia    Blood work done today including CPK and markers of inflammation. Continue naproxen as needed for pain.      Relevant Orders   Vitamin B12   VITAMIN D 25 Hydroxy (Vit-D Deficiency, Fractures)   Magnesium   CK   Sedimentation rate   Right hip pain    Negative x-rays.  No red flag signs or symptoms. Most likely related to activities of daily living. Review Naprosyn as needed.      Relevant Orders   DG HIP UNILAT W OR W/O PELVIS 2-3 VIEWS RIGHT (Completed)   Right lumbar pain    Mild degenerative changes seen on x-ray. Continue Naprosyn as needed for pain. Most likely mechanical in nature.      Relevant Orders   DG Lumbar Spine 2-3 Views (Completed)   Right flank pain - Primary    Most likely musculoskeletal. We will check urinalysis and blood work today. Differential diagnosis discussed with  patient.      Relevant Orders   Urinalysis   CBC with Differential/Platelet   Comprehensive metabolic panel   Hemoglobin A1c   Lipid panel   Patient Instructions  Mantenimiento de la salud en [place] Maintenance, Female Adoptar un estilo de vida saludable y recibir atencin preventiva son importantes para promover la salud y Musician. Consulte al mdico sobre: El esquema adecuado para hacerse pruebas y exmenes peridicos. Cosas que puede hacer por su cuenta para prevenir enfermedades y [place] sano. Qu debo saber sobre la dieta, el peso y el ejercicio? Consuma una dieta saludable  Consuma una dieta que incluya muchas verduras, frutas, productos lcteos con bajo contenido de [place] y [name]. No consuma muchos alimentos ricos en grasas slidas, azcares agregados o sodio. Mantenga un peso saludable El ndice de masa muscular [facility]) se [place] para identificar problemas de [place]. Proporciona una estimacin de la grasa corporal basndose en el peso y la altura. Su mdico puede ayudarle a Radiation protection practitioner [place] y a Scientist, forensic o Theatre manager un peso saludable. Haga ejercicio con regularidad Haga ejercicio con regularidad. Esta es una de las prcticas ms importantes que puede hacer por su salud. La [employer] de los adultos deben seguir estas pautas: Optometrist, al menos, 150 minutos de actividad fsica por semana. El ejercicio debe aumentar la frecuencia cardaca y Nature conservation officer transpirar (ejercicio de intensidad moderada). Hacer ejercicios de fortalecimiento por lo [employer] por semana. Agregue esto a su plan de ejercicio de intensidad moderada. Pase menos tiempo sentada. Incluso la actividad fsica ligera puede ser beneficiosa. Controle sus niveles de colesterol y lpidos en la sangre Comience a realizarse anlisis de lpidos y Research officer, trade union en la sangre a los 10 aos y luego reptalos cada 5 aos. Hgase controlar los niveles de colesterol con mayor frecuencia si: Sus niveles de  lpidos y colesterol son altos. Es mayor de 74 aos. Presenta un alto riesgo de  padecer enfermedades cardacas. Qu debo saber sobre las pruebas de deteccin del cncer? Segn su historia clnica y sus antecedentes familiares, es posible que deba realizarse pruebas de deteccin del cncer en diferentes edades. Esto puede incluir pruebas de deteccin de lo siguiente: Cncer de mama. Cncer de cuello uterino. Cncer colorrectal. Cncer de piel. Cncer de pulmn. Qu debo saber sobre la enfermedad cardaca, la diabetes y la hipertensin arterial? Presin arterial y enfermedad cardaca La hipertensin arterial causa enfermedades cardacas y [place] el riesgo de accidente cerebrovascular. Es ms probable que esto se manifieste en las personas que tienen lecturas de presin arterial alta o tienen sobrepeso. Hgase controlar la presin arterial: Cada 3 a 5 aos si tiene entre 18 y 48 aos. Todos los aos si es mayor de 40 aos. Diabetes Realcese exmenes de deteccin de la diabetes con regularidad. Este anlisis revisa el nivel de azcar en la sangre en [place]. Hgase las pruebas de deteccin: Cada tres aos despus de los 53 aos de edad si tiene un peso normal y un bajo riesgo de padecer diabetes. Con ms frecuencia y a partir de [place] edad inferior si tiene sobrepeso o un alto riesgo de padecer diabetes. Qu debo saber sobre la prevencin de infecciones? Hepatitis B Si tiene un riesgo ms alto de contraer hepatitis B, debe someterse a un examen de deteccin de este virus. Hable con el mdico para averiguar si tiene riesgo de contraer la infeccin por hepatitis B. Hepatitis C Se recomienda el anlisis a: [employer] 1945 y 1965. Todas las personas que tengan un riesgo de haber contrado hepatitis C. Enfermedades de transmisin sexual (ETS) Hgase las pruebas de Programme researcher, broadcasting/film/video de ITS, incluidas la gonorrea y la clamidia, si: Es sexualmente activa y es menor de 19 aos. Es mayor  de 20 aos, y Investment banker, operational informa que corre riesgo de tener este tipo de infecciones. La actividad sexual ha cambiado desde que le hicieron la ltima prueba de deteccin y tiene un riesgo mayor de Best boy clamidia o Radio broadcast assistant. Pregntele al mdico si usted tiene riesgo. Pregntele al mdico si usted tiene un alto riesgo de Museum/gallery curator VIH. El mdico tambin puede recomendarle un medicamento recetado para ayudar a evitar la infeccin por el VIH. Si elige tomar medicamentos para prevenir el VIH, primero debe [employer] de deteccin del VIH. Luego debe hacerse anlisis cada 3 meses mientras est tomando los medicamentos. Embarazo Si est por dejar de Librarian, academic (fase premenopusica) y usted puede quedar [place], busque asesoramiento antes de [place]. Tome de 400 a 800 microgramos (mcg) de cido [employer] si [place]. Pida mtodos de control de la natalidad (anticonceptivos) si desea evitar un embarazo no deseado. Osteoporosis y [place] La osteoporosis es una enfermedad en la que los huesos pierden los minerales y la fuerza por el avance de la edad. El resultado pueden ser fracturas en los [place]. Si tiene 55 aos o ms, o si est en riesgo de sufrir osteoporosis y fracturas, pregunte a su mdico si debe: Hacerse pruebas de deteccin de prdida sea. Tomar un suplemento de calcio o de vitamina D para reducir el riesgo de fracturas. Recibir terapia de reemplazo hormonal (TRH) para tratar los sntomas de la menopausia. Siga estas indicaciones en su casa: Consumo de alcohol No beba alcohol si: Su mdico le indica no hacerlo. Est embarazada, puede estar embarazada o est tratando de [place]. Si bebe alcohol: Limite la cantidad que bebe a lo siguiente: De 0 a 1  bebida por da. Sepa cunta cantidad de alcohol hay en las bebidas que toma. En los [place], una medida equivale a una botella de cerveza de 12 oz (355 ml), un vaso de vino de 5 oz (148 ml)  o un vaso de una bebida alcohlica de alta graduacin de 1 oz (44 ml). Estilo de vida No consuma ningn producto que contenga nicotina o tabaco. Estos productos incluyen cigarrillos, tabaco para Higher education careers adviser y aparatos de vapeo, como los Psychologist, sport and exercise. Si necesita ayuda para dejar de consumir estos productos, consulte al mdico. No consuma drogas. No comparta agujas. Solicite ayuda a su mdico si necesita apoyo o informacin para abandonar las drogas. Indicaciones generales Realcese los estudios de rutina de la salud, dentales y de Public librarian. [place]. Infrmele a su mdico si: Se siente deprimida con frecuencia. Alguna vez ha sido vctima de [place] o no se siente seguro en su casa. Resumen Adoptar un estilo de vida saludable y recibir atencin preventiva son importantes para promover la salud y Musician. Siga las instrucciones del mdico acerca de una dieta saludable, el ejercicio y la realizacin de pruebas o exmenes para Engineer, building services. Siga las instrucciones del mdico con respecto al control del colesterol y la presin arterial. Esta informacin no tiene Marine scientist el consejo del mdico. Asegrese de hacerle al mdico cualquier pregunta que tenga. Document Revised: 08/18/2020 Document Reviewed: 08/18/2020 Elsevier Patient Education  [place], MD [facility]

## 2022-01-15 ENCOUNTER — Other Ambulatory Visit: Payer: Self-pay

## 2022-01-15 ENCOUNTER — Emergency Department (HOSPITAL_BASED_OUTPATIENT_CLINIC_OR_DEPARTMENT_OTHER)
Admission: EM | Admit: 2022-01-15 | Discharge: 2022-01-15 | Disposition: A | Discharge status: Home / Self Care | Payer: BC Managed Care – PPO | Attending: Emergency Medicine | Admitting: Emergency Medicine

## 2022-01-15 ENCOUNTER — Encounter: Payer: Self-pay | Admitting: Emergency Medicine

## 2022-01-15 ENCOUNTER — Encounter (HOSPITAL_BASED_OUTPATIENT_CLINIC_OR_DEPARTMENT_OTHER): Payer: Self-pay | Admitting: Emergency Medicine

## 2022-01-15 ENCOUNTER — Emergency Department (HOSPITAL_BASED_OUTPATIENT_CLINIC_OR_DEPARTMENT_OTHER): Payer: BC Managed Care – PPO

## 2022-01-15 DIAGNOSIS — R1084 Generalized abdominal pain: Secondary | ICD-10-CM | POA: Diagnosis present

## 2022-01-15 DIAGNOSIS — E039 Hypothyroidism, unspecified: Secondary | ICD-10-CM | POA: Diagnosis not present

## 2022-01-15 DIAGNOSIS — Z8616 Personal history of COVID-19: Secondary | ICD-10-CM | POA: Insufficient documentation

## 2022-01-15 DIAGNOSIS — R110 Nausea: Secondary | ICD-10-CM | POA: Diagnosis not present

## 2022-01-15 LAB — URINALYSIS, ROUTINE W REFLEX MICROSCOPIC
Bilirubin Urine: NEGATIVE
Glucose, UA: NEGATIVE mg/dL
Hgb urine dipstick: NEGATIVE
Ketones, ur: NEGATIVE mg/dL
Nitrite: NEGATIVE
Protein, ur: NEGATIVE mg/dL
Specific Gravity, Urine: 1.016 (ref 1.005–1.030)
pH: 6.5 (ref 5.0–8.0)

## 2022-01-15 LAB — COMPREHENSIVE METABOLIC PANEL
ALT: 18 U/L (ref 0–44)
AST: 19 U/L (ref 15–41)
Albumin: 4.3 g/dL (ref 3.5–5.0)
Alkaline Phosphatase: 84 U/L (ref 38–126)
Anion gap: 7 (ref 5–15)
BUN: 15 mg/dL (ref 6–20)
CO2: 28 mmol/L (ref 22–32)
Calcium: 9.1 mg/dL (ref 8.9–10.3)
Chloride: 105 mmol/L (ref 98–111)
Creatinine, Ser: 0.65 mg/dL (ref 0.44–1.00)
GFR, Estimated: >60 mL/min (ref >60)
Glucose, Bld: 96 mg/dL (ref 70–99)
Potassium: 3.7 mmol/L (ref 3.5–5.1)
Sodium: 140 mmol/L (ref 135–145)
Total Bilirubin: 0.6 mg/dL (ref 0.3–1.2)
Total Protein: 7.3 g/dL (ref 6.5–8.1)

## 2022-01-15 LAB — CBC
HCT: 38.5 % (ref 36.0–46.0)
Hemoglobin: 13.2 g/dL (ref 12.0–15.0)
MCH: 31.3 pg (ref 26.0–34.0)
MCHC: 34.3 g/dL (ref 30.0–36.0)
MCV: 91.2 fL (ref 80.0–100.0)
Platelets: 219 10*3/uL (ref 150–400)
RBC: 4.22 MIL/uL (ref 3.87–5.11)
RDW: 13.1 % (ref 11.5–15.5)
WBC: 6 10*3/uL (ref 4.0–10.5)
nRBC: 0 % (ref 0.0–0.2)

## 2022-01-15 LAB — LIPASE, BLOOD: Lipase: <10 U/L — ABNORMAL LOW (ref 11–51)

## 2022-01-15 MED ORDER — SODIUM CHLORIDE 0.9 % IV BOLUS
500.0000 mL | Freq: Once | INTRAVENOUS | Status: AC
  Administered 2022-01-15: 500 mL via INTRAVENOUS

## 2022-01-15 MED ORDER — IOHEXOL 300 MG/ML  SOLN
100.0000 mL | Freq: Once | INTRAMUSCULAR | Status: AC | PRN
  Administered 2022-01-15: 100 mL via INTRAVENOUS

## 2022-01-15 MED ORDER — ONDANSETRON 4 MG PO TBDP: 4.0000 mg | ORAL_TABLET | Freq: Three times a day (TID) | ORAL | 0 refills | Status: AC | PRN

## 2022-01-15 MED ORDER — DICYCLOMINE HCL 20 MG PO TABS: 20.0000 mg | ORAL_TABLET | Freq: Three times a day (TID) | ORAL | 0 refills | Status: DC | PRN

## 2022-01-15 NOTE — ED Provider Notes (Signed)
Emergency Department Provider Note   I have reviewed the triage vital signs and the nursing notes.   HISTORY  Chief Complaint Abdominal Pain   HPI Linda Reese is a 61 y.o. female with past history of IBS, diverticulosis, anal fissure presents to the emergency department with crampy abdominal pain which radiates from the right side of the abdomen across mainly the epigastric region.  She is feeling some discomfort into her back and upper thigh area.  No dysuria, hesitancy, urgency.  No vaginal bleeding or discharge.  She has some nausea but no vomiting.  No diarrhea.  She states that she has had episodes like this in the past but does not follow with GI locally.  She denies any new features to today's pain although regards that it is somewhat more severe than normal which prompted her to seek ED evaluation.   Past Medical History:  Diagnosis Date   Allergy    Anal fissure    Anxiety    Chronic headaches    COVID-19    Diverticulosis    Hemorrhoids    Hypothyroidism    IBS (irritable bowel syndrome)    Vitamin D deficiency     Review of Systems  Constitutional: No fever/chills Eyes: No visual changes. ENT: No sore throat. Cardiovascular: Denies chest pain. Respiratory: Denies shortness of breath. Gastrointestinal: Positive abdominal pain.  No nausea, no vomiting.  No diarrhea.  No constipation. Genitourinary: Negative for dysuria. Musculoskeletal: Positive for back pain. Skin: Negative for rash. Neurological: Negative for headaches, focal weakness or numbness.  ____________________________________________   PHYSICAL EXAM:  VITAL SIGNS: ED Triage Vitals  Enc Vitals Group     BP 01/15/22 1419 123/63     Pulse Rate 01/15/22 1418 70     Resp 01/15/22 1418 16     Temp 01/15/22 1418 98.1 F (36.7 C)     Temp Source 01/15/22 1418 Oral     SpO2 01/15/22 1418 99 %   Constitutional: Alert and oriented. Well appearing and in no acute distress. Eyes:  Conjunctivae are normal.  Head: Atraumatic. Nose: No congestion/rhinnorhea. Mouth/Throat: Mucous membranes are moist.  Neck: No stridor.   Cardiovascular: Normal rate, regular rhythm. Good peripheral circulation. Grossly normal heart sounds.   Respiratory: Normal respiratory effort.  No retractions. Lungs CTAB. Gastrointestinal: Soft with mainly tenderness in the epigastric region. No rebound or guarding. No lower abdominal tenderness. No distention.  Musculoskeletal: No lower extremity tenderness nor edema. No gross deformities of extremities. Neurologic:  Normal speech and language. No gross focal neurologic deficits are appreciated.  Skin:  Skin is warm, dry and intact. No rash noted.   ____________________________________________   LABS (all labs ordered are listed, but only abnormal results are displayed)  Labs Reviewed  LIPASE, BLOOD - Abnormal; Notable for the following components:      Result Value   Lipase <10 (*)    All other components within normal limits  URINALYSIS, ROUTINE W REFLEX MICROSCOPIC - Abnormal; Notable for the following components:   Leukocytes,Ua SMALL (*)    Bacteria, UA RARE (*)    Non Squamous Epithelial 0-5 (*)    All other components within normal limits  COMPREHENSIVE METABOLIC PANEL  CBC   ____________________________________________  EKG   EKG Interpretation  Date/Time:  Monday January 15 2022 14:32:46 EDT Ventricular Rate:  73 PR Interval:  114 QRS Duration: 84 QT Interval:  384 QTC Calculation: 423 R Axis:   60 Text Interpretation: Sinus rhythm with Premature atrial  complexes with Abberant conduction Low voltage QRS Borderline ECG No previous ECGs available Confirmed by Nanda Quinton [phone]) on 01/15/2022 4:37:09 PM        ____________________________________________  RADIOLOGY  No results found.  ____________________________________________   PROCEDURES  Procedure(s) performed:    Procedures   ____________________________________________   INITIAL IMPRESSION / ASSESSMENT AND PLAN / ED COURSE  Pertinent labs & imaging results that were available during my care of the patient were reviewed by me and considered in my medical decision making (see chart for details).   This patient is Presenting for Evaluation of abdominal pain, which does require a range of treatment options, and is a complaint that involves a high risk of morbidity and mortality.  The Differential Diagnoses  includes but is not exclusive to acute cholecystitis, intrathoracic causes for epigastric abdominal pain, gastritis, duodenitis, pancreatitis, small bowel or large bowel obstruction, abdominal aortic aneurysm, hernia, gastritis, etc.   Critical Interventions-    Medications  sodium chloride 0.9 % bolus 500 mL (has no administration in time range)    Reassessment after intervention:     I did obtain Additional Historical Information from spouse at bedside.  I decided to review pertinent External Data, and in summary PCP visit from 10/16 reviewed showing right flank and lumbar pain thought to be musculoskeletal at that time.   Clinical Laboratory Tests Ordered, included no leukocytosis or anemia.  No acute kidney injury.  LFTs and bilirubin are normal.  No UTI.  Radiologic Tests Ordered, included ***. I independently interpreted the images and agree with radiology interpretation.   Cardiac Monitor Tracing which shows NSR.    Social Determinants of Health Risk patient is a non-smoker.   Consult complete with  Medical Decision Making: Summary:  Patient presents to the emergency department with back and abdominal pain.  Describes more right side abdominal pain now radiating over with mild tenderness mainly in the epigastric region.  Plan for CT imaging of the abdomen pelvis with contrast and reassess.  Labs are reassuring.  Reevaluation with update and discussion with    ***Considered admission***  Disposition:   ____________________________________________  FINAL CLINICAL IMPRESSION(S) / ED DIAGNOSES  Final diagnoses:  None     NEW OUTPATIENT MEDICATIONS STARTED DURING THIS VISIT:  New Prescriptions   No medications on file    Note:  This document was prepared using Dragon voice recognition software and may include unintentional dictation errors.  Nanda Quinton, MD, [facility]

## 2022-01-15 NOTE — ED Notes (Signed)
Reviewed AVS/discharge instruction with patient. Time allotted for and all questions answered. Patient is agreeable for d/c and escorted to ed exit by staff.  

## 2022-01-15 NOTE — Discharge Instructions (Signed)

## 2022-01-15 NOTE — ED Triage Notes (Signed)
Pelvic pain and around back and upper thighs, started about a week ago, went to primary ,xray of back and was negative. Then pain worsened to back and is worse today.

## 2022-02-21 ENCOUNTER — Encounter: Payer: Self-pay | Admitting: Gastroenterology

## 2022-02-21 ENCOUNTER — Ambulatory Visit (INDEPENDENT_AMBULATORY_CARE_PROVIDER_SITE_OTHER): Payer: BC Managed Care – PPO | Admitting: Gastroenterology

## 2022-02-21 VITALS — HR 81 | Ht 62.0 in | Wt 172.5 lb

## 2022-02-21 DIAGNOSIS — R21 Rash and other nonspecific skin eruption: Secondary | ICD-10-CM

## 2022-02-21 DIAGNOSIS — K589 Irritable bowel syndrome without diarrhea: Secondary | ICD-10-CM

## 2022-02-21 MED ORDER — DICYCLOMINE HCL 20 MG PO TABS: 20.0000 mg | ORAL_TABLET | Freq: Three times a day (TID) | ORAL | 1 refills | Status: AC | PRN

## 2022-02-21 MED ORDER — DESITIN 13 % EX CREA: TOPICAL_CREAM | CUTANEOUS | Status: AC

## 2022-02-21 NOTE — Progress Notes (Signed)
Assessment and plan noted ?

## 2022-02-21 NOTE — Patient Instructions (Addendum)
If you are age 59 or older, your body mass index should be between 23-30. Your Body mass index is 31.55 kg/m. If this is out of the aforementioned range listed, please consider follow up with your Primary Care Provider.  If you are age 71 or younger, your body mass index should be between 19-25. Your Body mass index is 31.55 kg/m. If this is out of the aformentioned range listed, please consider follow up with your Primary Care Provider.   ________________________________________________________   We have sent the following medications to your pharmacy for you to pick up at your convenience: Bentyl 20 mg: Take three times a day as needed  Please purchase the following medications over the counter and take as directed: Desitin: Use daily at bedtime  Please keep the perianal area clean and dry.  Thank you for entrusting me with your care and for choosing [employer], Alonza Bogus, P.A[facility] - C.

## 2022-02-21 NOTE — Progress Notes (Signed)
02/21/2022 Linda Reese [id number] 03-12-1961   HISTORY OF PRESENT ILLNESS: This is a 61 year old female who is a patient of Dr. Blanch Media.  She was last seen here in June 2022.  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  She was seen here previously for anal fissure, chronic constipation, excoriated perianal skin.  She was treated with diltiazem and Desitin.  Symptoms improved and anal fissure healed with that regimen.  She had a colonoscopy in February 2016 with Dr. Collene Mares.  Examination of the colon and terminal ileum were normal.  The examination was complete with photograph landmarks and good preparation.  Appropriate follow-up in 10 years was recommended.  She is here today again for evaluation regarding some similar perianal symptoms.  She says that she has a lot of perianal itching and irritation.  She was not sure if it was a rash or it was the fissure again.  She denies any bleeding except for just very light dots on the toilet paper.  No extreme pain.  She also tells me that back about a month ago she had an episode of right-sided abdominal pain that was going to her back and down her leg.  Was also having diarrhea, which is different than her usual constipation.  She was seen in the emergency department and CT scan of the abdomen and pelvis with contrast was unremarkable and labs were unrevealing as well.  They gave her some dicyclomine to use and she says that that did help.  She says she has had similar episode in the past when her "colon was stressed".  It sounds like maybe she had been given a diagnosis of IBS in the past.  She is feeling better.  She still gets some intermittent abdominal pain and would like some more Bentyl to have on hand.  Past Medical History:  Diagnosis Date   Allergy    Anal fissure    Anxiety    Chronic headaches    COVID-19    Diverticulosis    Hemorrhoids    Hypothyroidism    IBS (irritable  bowel syndrome)    Vitamin D deficiency    Past Surgical History:  Procedure Laterality Date   nexplanon  07/03/2010   insertion and removal   OVARY BIOPSY      reports that she has never smoked. She has never used smokeless tobacco. She reports current alcohol use. She reports that she does not use drugs. family history includes Anemia in her father; COPD in her father; Colon polyps in her sister; Diabetes in her maternal grandmother; Fibromyalgia in her sister; Heart attack in her maternal uncle and paternal grandfather; Heart disease in her maternal grandfather and mother; Hyperlipidemia in her father and mother; Irritable bowel syndrome in her sister; Lupus in her father; Stomach cancer in her maternal grandmother. No Known Allergies    Outpatient Encounter Medications as of 02/21/2022  Medication Sig   levothyroxine (SYNTHROID) 75 MCG tablet Take 1 tablet (75 mcg total) by mouth daily before breakfast.   Zinc Oxide (DESITIN) 13 % CREA Use daily at bedtime   [DISCONTINUED] dicyclomine (BENTYL) 20 MG tablet Take 1 tablet (20 mg total) by mouth 3 (three) times daily as needed for spasms.   amoxicillin (AMOXIL) 875 MG tablet Take 875 mg by mouth 2 (two) times daily. (Patient not taking: Reported on 02/21/2022)   dicyclomine (BENTYL) 20 MG tablet Take 1 tablet (20 mg  total) by mouth 3 (three) times daily as needed for spasms.   ondansetron (ZOFRAN-ODT) 4 MG disintegrating tablet Take 1 tablet (4 mg total) by mouth every 8 (eight) hours as needed. (Patient not taking: Reported on 02/21/2022)   No facility-administered encounter medications on file as of 02/21/2022.    REVIEW OF SYSTEMS  : All other systems reviewed and negative except where noted in the History of Present Illness.   PHYSICAL EXAM: Pulse 81   Ht '5\' 2"'$  (1.575 m)   Wt 172 lb 8 oz (78.2 kg)   LMP 06/15/2015   BMI 31.55 kg/m  General: Well developed female in no acute distress Head: Normocephalic and atraumatic Eyes:   Sclerae anicteric, conjunctiva pink. Ears: Normal auditory acuity Lungs: Clear throughout to auscultation; no W/R/R. Heart: Regular rate and rhythm; no M/R/G. Abdomen: Soft, non-distended.  BS present.  Non-tender. Rectal:  No significant external abnormalities are noted, but there are some perianal skin changes consistent with previous irritation and excoriation.  DRE did not reveal any masses. Musculoskeletal: Symmetrical with no gross deformities  Skin: No lesions on visible extremities Extremities: No edema  Neurological: Alert oriented x 4, grossly non-focal Psychological:  Alert and cooperative. Normal mood and affect  ASSESSMENT AND PLAN: *Excoriated perianal skin/rash: Not extremely red or irritated today.  Had improved and healed with Desitin previously.  I have recommended that she use that at bedtime as needed.  Also advised her to keep the area clean, but do not plan to aggressively with rubbing, etc.  I also advised her to keep the area dry. *Right-sided abdominal pain with diarrhea: This episode occurred about a month ago.  CT scan of the abdomen and pelvis with contrast at the ER was unremarkable and labs were unrevealing as well.  She said that the dicyclomine that gave her has helped.  Symptoms much improved at this point with only occasional abdominal pain.  From her history it sounds like she has had similar symptoms in the past its likely been IBS.  She can continue the dicyclomine as needed and we can renew that prescription for her.  That was sent to her pharmacy.   CC:  Horald Pollen, [place]

## 2022-03-06 ENCOUNTER — Ambulatory Visit (INDEPENDENT_AMBULATORY_CARE_PROVIDER_SITE_OTHER): Payer: BC Managed Care – PPO | Admitting: Obstetrics & Gynecology

## 2022-03-06 ENCOUNTER — Encounter: Payer: Self-pay | Admitting: Obstetrics & Gynecology

## 2022-03-06 ENCOUNTER — Other Ambulatory Visit (HOSPITAL_COMMUNITY)
Admission: RE | Admit: 2022-03-06 | Discharge: 2022-03-06 | Disposition: A | Discharge status: Home / Self Care | Payer: BC Managed Care – PPO | Source: Ambulatory Visit | Attending: Obstetrics & Gynecology | Admitting: Obstetrics & Gynecology

## 2022-03-06 VITALS — BP 127/80 | HR 84 | Ht 62.0 in | Wt 170.6 lb

## 2022-03-06 DIAGNOSIS — Z01419 Encounter for gynecological examination (general) (routine) without abnormal findings: Secondary | ICD-10-CM | POA: Diagnosis not present

## 2022-03-06 DIAGNOSIS — N898 Other specified noninflammatory disorders of vagina: Secondary | ICD-10-CM | POA: Diagnosis not present

## 2022-03-06 NOTE — Progress Notes (Signed)
Pt presents for AEX. Pt c/o of leaking urine sometimes and vaginal itching.

## 2022-03-06 NOTE — Progress Notes (Signed)
Patient ID: Linda Reese, female   DOB: 09-21-1960, 61 y.o.   MRN: [id number]  Chief Complaint  Patient presents with   Gynecologic Exam    HPI Linda Reese - Cah Linda Reese is a 61 y.o. female.  [id number] Patient's last menstrual period was 06/15/2015. Postmenopausal, last pap ASCUS neg HR HPV 11/2020 nest pap after 3 years from then. Recently evaluated for RLQ pain with negative CT and GI evaluation HPI  Past Medical History:  Diagnosis Date   Allergy    Anal fissure    Anxiety    Chronic headaches    COVID-19    Diverticulosis    Hemorrhoids    Hypothyroidism    IBS (irritable bowel syndrome)    Vitamin D deficiency     Past Surgical History:  Procedure Laterality Date   nexplanon  07/03/2010   insertion and removal   OVARY BIOPSY      Family History  Problem Relation Age of Onset   Diabetes Maternal Grandmother    Stomach cancer Maternal Grandmother    Heart disease Maternal Grandfather    Hyperlipidemia Mother    Heart disease Mother    Lupus Father    Anemia Father    COPD Father    Hyperlipidemia Father    Heart attack Paternal Grandfather    Colon polyps Sister    Heart attack Maternal Uncle    Irritable bowel syndrome Sister    Fibromyalgia Sister    Colon cancer Neg Hx     Social History Social History   Tobacco Use   Smoking status: Never   Smokeless tobacco: Never  Vaping Use   Vaping Use: Never used  Substance Use Topics   Alcohol use: Yes    Alcohol/week: 0.0 standard drinks of alcohol    Comment: "sometimes" weekend wine and margarita   Drug use: No    No Known Allergies  Current Outpatient Medications  Medication Sig Dispense Refill   dicyclomine (BENTYL) 20 MG tablet Take 1 tablet (20 mg total) by mouth 3 (three) times daily as needed for spasms. 60 tablet 1   levothyroxine (SYNTHROID) 75 MCG tablet Take 1 tablet (75 mcg total) by mouth daily before breakfast. 90 tablet 3   Zinc Oxide (DESITIN) 13 % CREA Use daily  at bedtime     amoxicillin (AMOXIL) 875 MG tablet Take 875 mg by mouth 2 (two) times daily. (Patient not taking: Reported on 02/21/2022)     ondansetron (ZOFRAN-ODT) 4 MG disintegrating tablet Take 1 tablet (4 mg total) by mouth every 8 (eight) hours as needed. (Patient not taking: Reported on 02/21/2022) 20 tablet 0   No current facility-administered medications for this visit.    Review of Systems Review of Systems  Constitutional: Negative.   Respiratory: Negative.    Cardiovascular: Negative.   Gastrointestinal:  Positive for abdominal pain (Mild RLQ) and constipation. Negative for abdominal distention, anal bleeding, diarrhea and nausea.  Genitourinary:  Negative for pelvic pain.       Vaginal itching    Blood pressure 127/80, pulse 84, height '5\' 2"'$  (1.575 m), weight 170 lb 9.6 oz (77.4 kg), last menstrual period 06/15/2015.  Physical Exam Physical Exam Vitals and nursing note reviewed. Exam conducted with a chaperone present.  Constitutional:      Appearance: Normal appearance.  HENT:     Head: Normocephalic and atraumatic.  Cardiovascular:     Rate and Rhythm: Normal rate.     Heart sounds: Normal heart sounds.  Pulmonary:  Effort: Pulmonary effort is normal.     Breath sounds: Normal breath sounds.  Chest:  Breasts:    Right: Normal.     Left: Normal.  Abdominal:     General: Abdomen is flat.     Palpations: Abdomen is soft.  Genitourinary:    General: Normal vulva.     Vagina: Normal.     Cervix: No discharge.     Uterus: Normal.      Adnexa: Right adnexa normal and left adnexa normal.       Right: No tenderness.         Left: No tenderness.    Musculoskeletal:     Cervical back: Normal range of motion and neck supple.  Skin:    General: Skin is warm and dry.  Neurological:     General: No focal deficit present.     Mental Status: She is alert.  Psychiatric:        Mood and Affect: Mood normal.        Behavior: Behavior normal.     Data  Reviewed Narrative & Impression  CLINICAL DATA:  Acute abdominal pain for 1 week   EXAM: CT ABDOMEN AND PELVIS WITH CONTRAST   TECHNIQUE: Multidetector CT imaging of the abdomen and pelvis was performed using the standard protocol following bolus administration of intravenous contrast.   RADIATION DOSE REDUCTION: This exam was performed according to the departmental dose-optimization program which includes automated exposure control, adjustment of the mA and/or kV according to patient size and/or use of iterative reconstruction technique.   CONTRAST:  85 mL OMNIPAQUE 300   COMPARISON:  01/26/2020   FINDINGS: Lower chest: No acute abnormality.   Hepatobiliary: No focal liver abnormality is seen. No gallstones, gallbladder wall thickening, or biliary dilatation.   Pancreas: Unremarkable. No pancreatic ductal dilatation or surrounding inflammatory changes.   Spleen: Normal in size without focal abnormality.   Adrenals/Urinary Tract: Adrenal glands are within normal limits. Kidneys demonstrate a normal enhancement pattern bilaterally. Normal excretion is noted on delayed images. No renal or ureteral calculi are noted. The bladder is well distended.   Stomach/Bowel: The appendix is well visualized and within normal limits. No obstructive or inflammatory changes of the colon are seen.   Vascular/Lymphatic: No significant vascular findings are present. No enlarged abdominal or pelvic lymph nodes.   Reproductive: Uterus and bilateral adnexa are unremarkable.   Other: No abdominal wall hernia or abnormality. No abdominopelvic ascites.   Musculoskeletal: Mild degenerative changes of lumbar spine are noted.   IMPRESSION: No acute abnormality noted.     Electronically Signed   By: Inez Catalina M.D.   On: 01/15/2022 19:00    Assessment Well woman exam with routine gynecological exam  Vaginal itching - Plan: Cervicovaginal ancillary only( ) Benign  abdominal and pelvic exam  Plan RTC annual Mammogram annually Pap in 2025 Review ancillary cytology result   Linda Reese 03/06/2022, 11:25 AM

## 2022-03-07 LAB — CERVICOVAGINAL ANCILLARY ONLY
Bacterial Vaginitis (gardnerella): NEGATIVE
Candida Glabrata: NEGATIVE
Candida Vaginitis: NEGATIVE
Comment: NEGATIVE
Comment: NEGATIVE
Comment: NEGATIVE

## 2022-12-20 LAB — HM MAMMOGRAPHY

## 2022-12-24 ENCOUNTER — Encounter: Payer: Self-pay | Admitting: Emergency Medicine

## 2023-01-19 ENCOUNTER — Other Ambulatory Visit: Payer: Self-pay | Admitting: Emergency Medicine

## 2023-01-19 DIAGNOSIS — E039 Hypothyroidism, unspecified: Secondary | ICD-10-CM

## 2023-03-13 ENCOUNTER — Ambulatory Visit: Payer: BC Managed Care – PPO | Admitting: Emergency Medicine

## 2023-03-13 ENCOUNTER — Encounter: Payer: Self-pay | Admitting: Emergency Medicine

## 2023-03-13 VITALS — BP 122/80 | HR 94 | Temp 98.2°F | Ht 62.0 in | Wt 173.0 lb

## 2023-03-13 DIAGNOSIS — E559 Vitamin D deficiency, unspecified: Secondary | ICD-10-CM | POA: Diagnosis not present

## 2023-03-13 DIAGNOSIS — E038 Other specified hypothyroidism: Secondary | ICD-10-CM

## 2023-03-13 DIAGNOSIS — E039 Hypothyroidism, unspecified: Secondary | ICD-10-CM

## 2023-03-13 DIAGNOSIS — R5381 Other malaise: Secondary | ICD-10-CM

## 2023-03-13 LAB — LIPID PANEL
Cholesterol: 179 mg/dL (ref 0–200)
HDL: 48.1 mg/dL (ref >39.00)
LDL Cholesterol: 106 mg/dL — ABNORMAL HIGH (ref 0–99)
NonHDL: 130.65
Total CHOL/HDL Ratio: 4
Triglycerides: 125 mg/dL (ref 0.0–149.0)
VLDL: 25 mg/dL (ref 0.0–40.0)

## 2023-03-13 LAB — CBC WITH DIFFERENTIAL/PLATELET
Basophils Absolute: 0 10*3/uL (ref 0.0–0.1)
Basophils Relative: 0.6 % (ref 0.0–3.0)
Eosinophils Absolute: 0.3 10*3/uL (ref 0.0–0.7)
Eosinophils Relative: 4 % (ref 0.0–5.0)
HCT: 40.4 % (ref 36.0–46.0)
Hemoglobin: 13.7 g/dL (ref 12.0–15.0)
Lymphocytes Relative: 27.9 % (ref 12.0–46.0)
Lymphs Abs: 1.8 10*3/uL (ref 0.7–4.0)
MCHC: 33.9 g/dL (ref 30.0–36.0)
MCV: 93.2 fL (ref 78.0–100.0)
Monocytes Absolute: 0.5 10*3/uL (ref 0.1–1.0)
Monocytes Relative: 7.7 % (ref 3.0–12.0)
Neutro Abs: 3.8 10*3/uL (ref 1.4–7.7)
Neutrophils Relative %: 59.8 % (ref 43.0–77.0)
Platelets: 266 10*3/uL (ref 150.0–400.0)
RBC: 4.33 Mil/uL (ref 3.87–5.11)
RDW: 13.5 % (ref 11.5–15.5)
WBC: 6.3 10*3/uL (ref 4.0–10.5)

## 2023-03-13 LAB — COMPREHENSIVE METABOLIC PANEL
ALT: 36 U/L — ABNORMAL HIGH (ref 0–35)
AST: 30 U/L (ref 0–37)
Albumin: 4.4 g/dL (ref 3.5–5.2)
Alkaline Phosphatase: 112 U/L (ref 39–117)
BUN: 12 mg/dL (ref 6–23)
CO2: 29 meq/L (ref 19–32)
Calcium: 9.1 mg/dL (ref 8.4–10.5)
Chloride: 105 meq/L (ref 96–112)
Creatinine, Ser: 0.88 mg/dL (ref 0.40–1.20)
GFR: 71.63 mL/min (ref >60.00)
Glucose, Bld: 101 mg/dL — ABNORMAL HIGH (ref 70–99)
Potassium: 4 meq/L (ref 3.5–5.1)
Sodium: 140 meq/L (ref 135–145)
Total Bilirubin: 0.5 mg/dL (ref 0.2–1.2)
Total Protein: 7.6 g/dL (ref 6.0–8.3)

## 2023-03-13 LAB — TSH: TSH: 3.29 u[IU]/mL (ref 0.35–5.50)

## 2023-03-13 LAB — VITAMIN B12: Vitamin B-12: 398 pg/mL (ref 211–911)

## 2023-03-13 LAB — VITAMIN D 25 HYDROXY (VIT D DEFICIENCY, FRACTURES): VITD: 17.78 ng/mL — ABNORMAL LOW (ref 30.00–100.00)

## 2023-03-13 LAB — HEMOGLOBIN A1C: Hgb A1c MFr Bld: 5.6 % (ref 4.6–6.5)

## 2023-03-13 NOTE — Assessment & Plan Note (Signed)
Recommend repeat level today Recommend over-the-counter supplementation.

## 2023-03-13 NOTE — Patient Instructions (Signed)

## 2023-03-13 NOTE — Assessment & Plan Note (Signed)
Clinically stable.  No red flag signs or symptoms.  Differential diagnosis discussed.  Recommend blood work today. Multifactorial.  Advised to sleep better and stay well-hydrated Diet and nutrition discussed Benefits of exercise discussed

## 2023-03-13 NOTE — Assessment & Plan Note (Signed)
Clinically euthyroid.  TSH done today. Continue Synthroid 75 mcg daily. 

## 2023-03-13 NOTE — Progress Notes (Signed)
Linda Reese 62 y.o.   Chief Complaint  Patient presents with   Neck Pain    Right side neck pain due to swollen glands. Patient would like to rule an infection and have her vitamin D levels checked also right ear pain. Pt states that when laying down she has been having vertigo     HISTORY OF PRESENT ILLNESS: This is a 62 y.o. female complaining of neck pain, swollen glands and occasional vertigo for the past several weeks Complaining of intermittent malaise and general tiredness Also here for follow-up of chronic medical conditions  Neck Pain  Pertinent negatives include no chest pain, fever or headaches.     Prior to Admission medications   Medication Sig Start Date End Date Taking? Authorizing Provider  levothyroxine (SYNTHROID) 75 MCG tablet TAKE 1 TABLET(75 MCG) BY MOUTH DAILY BEFORE BREAKFAST 01/20/23  Yes Marsean Elkhatib, Eilleen Kempf, MD  amoxicillin (AMOXIL) 875 MG tablet Take 875 mg by mouth 2 (two) times daily. Patient not taking: Reported on 02/21/2022 01/07/22   [provider]  dicyclomine (BENTYL) 20 MG tablet Take 1 tablet (20 mg total) by mouth 3 (three) times daily as needed for spasms. 02/21/22   Zehr, Shanda Bumps D, PA-C  ondansetron (ZOFRAN-ODT) 4 MG disintegrating tablet Take 1 tablet (4 mg total) by mouth every 8 (eight) hours as needed. Patient not taking: Reported on 02/21/2022 01/15/22   Maia Plan, MD  Zinc Oxide (DESITIN) 13 % CREA Use daily at bedtime 02/21/22   Zehr, Shanda Bumps D, PA-C    No Known Allergies  Patient Active Problem List   Diagnosis Date Noted   Irritable bowel syndrome 02/21/2022   Thyroid activity decreased 06/10/2014   Other specified hypothyroidism 04/27/2014   Hemorrhoids 05/09/2012   Family history of lupus erythematosus 05/09/2012   DYSPEPSIA 07/06/2008   B12 DEFICIENCY 11/24/2007    Past Medical History:  Diagnosis Date   Allergy    Anal fissure    Anxiety    Chronic headaches    COVID-19     Diverticulosis    Hemorrhoids    Hypothyroidism    IBS (irritable bowel syndrome)    Vitamin D deficiency     Past Surgical History:  Procedure Laterality Date   nexplanon  07/03/2010   insertion and removal   OVARY BIOPSY      Social History   Socioeconomic History   Marital status: Married    Spouse name: Not on file   Number of children: 2   Years of education: Not on file   Highest education level: Bachelor's degree (e.g., BA, AB, BS)  Occupational History   Occupation: Teacher  Tobacco Use   Smoking status: Never   Smokeless tobacco: Never  Vaping Use   Vaping status: Never Used  Substance and Sexual Activity   Alcohol use: Yes    Alcohol/week: 0.0 standard drinks of alcohol    Comment: "sometimes" weekend wine and margarita   Drug use: No   Sexual activity: Yes    Birth control/protection: None    Comment: nexplanon. 1st intercourse- 26, partners- 1  Other Topics Concern   Not on file  Social History Narrative   Not on file   Social Drivers of Health   Financial Resource Strain: Low Risk  (03/10/2023)   Overall Financial Resource Strain (CARDIA)    Difficulty of Paying Living Expenses: Not hard at all  Food Insecurity: No Food Insecurity (03/10/2023)   Hunger Vital Sign    Worried About  Running Out of Food in the Last Year: Never true    Ran Out of Food in the Last Year: Never true  Transportation Needs: No Transportation Needs (03/10/2023)   PRAPARE - Administrator, Civil Service (Medical): No    Lack of Transportation (Non-Medical): No  Physical Activity: Insufficiently Active (03/10/2023)   Exercise Vital Sign    Days of Exercise per Week: 4 days    Minutes of Exercise per Session: 30 min  Stress: No Stress Concern Present (03/10/2023)   [employer] of Occupational Health - Occupational Stress Questionnaire    Feeling of Stress : Only a little  Social Connections: Socially Integrated (03/10/2023)   Social Connection and  Isolation Panel [NHANES]    Frequency of Communication with Friends and Family: Twice a week    Frequency of Social Gatherings with Friends and Family: Once a week    Attends Religious Services: More than 4 times per year    Active Member of [employer] or Organizations: Yes    Attends Engineer, structural: More than 4 times per year    Marital Status: Married  Catering manager Violence: Not on file    Family History  Problem Relation Age of Onset   Diabetes Maternal Grandmother    Stomach cancer Maternal Grandmother    Heart disease Maternal Grandfather    Hyperlipidemia Mother    Heart disease Mother    Lupus Father    Anemia Father    COPD Father    Hyperlipidemia Father    Heart attack Paternal Grandfather    Colon polyps Sister    Heart attack Maternal Uncle    Irritable bowel syndrome Sister    Fibromyalgia Sister    Colon cancer Neg Hx      Review of Systems  Constitutional: Negative.  Negative for chills and fever.  HENT:  Positive for sore throat.   Respiratory: Negative.  Negative for shortness of breath.   Cardiovascular: Negative.  Negative for chest pain and palpitations.  Gastrointestinal:  Negative for abdominal pain, diarrhea, nausea and vomiting.  Genitourinary: Negative.  Negative for dysuria and hematuria.  Musculoskeletal:  Positive for neck pain.  Skin: Negative.   Neurological: Negative.  Negative for dizziness and headaches.    Vitals:   03/13/23 1509  BP: 122/80  Pulse: 94  Temp: 98.2 F (36.8 C)  SpO2: 98%    Physical Exam Vitals reviewed.  Constitutional:      Appearance: Normal appearance.  HENT:     Head: Normocephalic.     Right Ear: Tympanic membrane, ear canal and external ear normal.     Left Ear: Tympanic membrane, ear canal and external ear normal.     Mouth/Throat:     Mouth: Mucous membranes are moist.     Pharynx: Oropharynx is clear.  Eyes:     Extraocular Movements: Extraocular movements intact.      Conjunctiva/sclera: Conjunctivae normal.     Pupils: Pupils are equal, round, and reactive to light.  Cardiovascular:     Rate and Rhythm: Normal rate and regular rhythm.     Pulses: Normal pulses.     Heart sounds: Normal heart sounds.  Pulmonary:     Effort: Pulmonary effort is normal.     Breath sounds: Normal breath sounds.  Musculoskeletal:     Cervical back: No tenderness.  Lymphadenopathy:     Cervical: No cervical adenopathy.  Skin:    General: Skin is warm and dry.  Capillary Refill: Capillary refill takes less than 2 seconds.  Neurological:     General: No focal deficit present.     Mental Status: She is alert and oriented to person, place, and time.  Psychiatric:        Mood and Affect: Mood normal.        Behavior: Behavior normal.      ASSESSMENT & PLAN: A total of 42 minutes was spent with the patient and counseling/coordination of care regarding preparing for this visit, review of most recent office visit notes, review of multiple chronic medical conditions and their management, review of all medications, review of most recent bloodwork results, review of health maintenance items, education on nutrition, prognosis, documentation, and need for follow up.  Problem List Items Addressed This Visit       Endocrine   Other specified hypothyroidism   Clinically euthyroid TSH done today Continue Synthroid 75 mcg daily      Thyroid activity decreased   Relevant Orders   CBC with Differential/Platelet   Comprehensive metabolic panel   Vitamin B12   VITAMIN D 25 Hydroxy (Vit-D Deficiency, Fractures)   Hemoglobin A1c   Lipid panel   TSH     Other   Malaise   Clinically stable.  No red flag signs or symptoms.  Differential diagnosis discussed.  Recommend blood work today. Multifactorial.  Advised to sleep better and stay well-hydrated Diet and nutrition discussed Benefits of exercise discussed      Relevant Orders   CBC with Differential/Platelet    Comprehensive metabolic panel   Vitamin B12   VITAMIN D 25 Hydroxy (Vit-D Deficiency, Fractures)   Hemoglobin A1c   Lipid panel   TSH   Vitamin D deficiency - Primary   Recommend repeat level today Recommend over-the-counter supplementation.      Relevant Orders   CBC with Differential/Platelet   Comprehensive metabolic panel   Vitamin B12   VITAMIN D 25 Hydroxy (Vit-D Deficiency, Fractures)   Hemoglobin A1c   Lipid panel   TSH   Patient Instructions  Health Maintenance, Female Adopting a healthy lifestyle and getting preventive care are important in promoting health and wellness. Ask your health care provider about: The right schedule for you to have regular tests and exams. Things you can do on your own to prevent diseases and keep yourself healthy. What should I know about diet, weight, and exercise? Eat a healthy diet  Eat a diet that includes plenty of vegetables, fruits, low-fat dairy products, and lean protein. Do not eat a lot of foods that are high in solid fats, added sugars, or sodium. Maintain a healthy weight Body mass index (BMI) is used to identify weight problems. It estimates body fat based on height and weight. Your health care provider can help determine your BMI and help you achieve or maintain a healthy weight. Get regular exercise Get regular exercise. This is one of the most important things you can do for your health. Most adults should: Exercise for at least 150 minutes each week. The exercise should increase your heart rate and make you sweat (moderate-intensity exercise). Do strengthening exercises at least twice a week. This is in addition to the moderate-intensity exercise. Spend less time sitting. Even light physical activity can be beneficial. Watch cholesterol and blood lipids Have your blood tested for lipids and cholesterol at 60 years of age, then have this test every 5 years. Have your cholesterol levels checked more often if: Your lipid or  cholesterol levels  are high. You are older than 60 years of age. You are at high risk for heart disease. What should I know about cancer screening? Depending on your health history and family history, you may need to have cancer screening at various ages. This may include screening for: Breast cancer. Cervical cancer. Colorectal cancer. Skin cancer. Lung cancer. What should I know about heart disease, diabetes, and high blood pressure? Blood pressure and heart disease High blood pressure causes heart disease and increases the risk of stroke. This is more likely to develop in people who have high blood pressure readings or are overweight. Have your blood pressure checked: Every 3-5 years if you are 65-60 years of age. Every year if you are 54 years old or older. Diabetes Have regular diabetes screenings. This checks your fasting blood sugar level. Have the screening done: Once every three years after age 43 if you are at a normal weight and have a low risk for diabetes. More often and at a younger age if you are overweight or have a high risk for diabetes. What should I know about preventing infection? Hepatitis B If you have a higher risk for hepatitis B, you should be screened for this virus. Talk with your health care provider to find out if you are at risk for hepatitis B infection. Hepatitis C Testing is recommended for: Everyone born from [birth year] through 1963. Anyone with known risk factors for hepatitis C. Sexually transmitted infections (STIs) Get screened for STIs, including gonorrhea and chlamydia, if: You are sexually active and are younger than 60 years of age. You are older than 60 years of age and your health care provider tells you that you are at risk for this type of infection. Your sexual activity has changed since you were last screened, and you are at increased risk for chlamydia or gonorrhea. Ask your health care provider if you are at risk. Ask your health care  provider about whether you are at high risk for HIV. Your health care provider may recommend a prescription medicine to help prevent HIV infection. If you choose to take medicine to prevent HIV, you should first get tested for HIV. You should then be tested every 3 months for as long as you are taking the medicine. Pregnancy If you are about to stop having your period (premenopausal) and you may become pregnant, seek counseling before you get pregnant. Take 400 to 800 micrograms (mcg) of folic acid every day if you become pregnant. Ask for birth control (contraception) if you want to prevent pregnancy. Osteoporosis and menopause Osteoporosis is a disease in which the bones lose minerals and strength with aging. This can result in bone fractures. If you are 67 years old or older, or if you are at risk for osteoporosis and fractures, ask your health care provider if you should: Be screened for bone loss. Take a calcium or vitamin D supplement to lower your risk of fractures. Be given hormone replacement therapy (HRT) to treat symptoms of menopause. Follow these instructions at home: Alcohol use Do not drink alcohol if: Your health care provider tells you not to drink. You are pregnant, may be pregnant, or are planning to become pregnant. If you drink alcohol: Limit how much you have to: 0-1 drink a day. Know how much alcohol is in your drink. In the [place] one drink equals one 12 oz bottle of beer (355 mL), one 5 oz glass of wine (148 mL), or one 1 oz glass of hard liquor (  44 mL). Lifestyle Do not use any products that contain nicotine or tobacco. These products include cigarettes, chewing tobacco, and vaping devices, such as e-cigarettes. If you need help quitting, ask your health care provider. Do not use street drugs. Do not share needles. Ask your health care provider for help if you need support or information about quitting drugs. General instructions Schedule regular health, dental, and  eye exams. Stay current with your vaccines. Tell your health care provider if: You often feel depressed. You have ever been abused or do not feel safe at home. Summary Adopting a healthy lifestyle and getting preventive care are important in promoting health and wellness. Follow your health care provider's instructions about healthy diet, exercising, and getting tested or screened for diseases. Follow your health care provider's instructions on monitoring your cholesterol and blood pressure. This information is not intended to replace advice given to you by your health care provider. Make sure you discuss any questions you have with your health care provider. Document Revised: 08/01/2020 Document Reviewed: 08/01/2020 Elsevier Patient Education  2024 [employer]  .     Edwina Barth, MD [facility]

## 2023-03-14 ENCOUNTER — Encounter: Payer: Self-pay | Admitting: Emergency Medicine

## 2023-03-14 ENCOUNTER — Other Ambulatory Visit: Payer: Self-pay | Admitting: Emergency Medicine

## 2023-03-14 DIAGNOSIS — E559 Vitamin D deficiency, unspecified: Secondary | ICD-10-CM

## 2023-03-14 MED ORDER — VITAMIN D (ERGOCALCIFEROL) 1.25 MG (50000 UNIT) PO CAPS: 50000.0000 [IU] | ORAL_CAPSULE | ORAL | 1 refills | Status: DC

## 2023-04-09 NOTE — Telephone Encounter (Signed)
 Antibiotics are not what viral infections require.  Thanks.

## 2023-06-23 ENCOUNTER — Other Ambulatory Visit: Payer: Self-pay | Admitting: Emergency Medicine

## 2023-06-23 DIAGNOSIS — E559 Vitamin D deficiency, unspecified: Secondary | ICD-10-CM

## 2023-09-30 ENCOUNTER — Other Ambulatory Visit: Payer: Self-pay | Admitting: Emergency Medicine

## 2023-09-30 DIAGNOSIS — E559 Vitamin D deficiency, unspecified: Secondary | ICD-10-CM

## 2024-01-03 ENCOUNTER — Other Ambulatory Visit: Payer: Self-pay | Admitting: Emergency Medicine

## 2024-01-03 DIAGNOSIS — E559 Vitamin D deficiency, unspecified: Secondary | ICD-10-CM

## 2024-02-13 ENCOUNTER — Other Ambulatory Visit: Payer: Self-pay | Admitting: Emergency Medicine

## 2024-02-13 DIAGNOSIS — E039 Hypothyroidism, unspecified: Secondary | ICD-10-CM

## 2024-03-09 ENCOUNTER — Other Ambulatory Visit: Payer: Self-pay | Admitting: Emergency Medicine

## 2024-03-09 DIAGNOSIS — E559 Vitamin D deficiency, unspecified: Secondary | ICD-10-CM

## 2024-04-14 LAB — HM MAMMOGRAPHY
